# Patient Record
Sex: Female | Born: 1970
Health system: Southern US, Community
[De-identification: ages and names within clinical notes are randomized; demographics above are authoritative.]

## PROBLEM LIST (undated history)

## (undated) DIAGNOSIS — J45909 Unspecified asthma, uncomplicated: Secondary | ICD-10-CM

## (undated) DIAGNOSIS — F259 Schizoaffective disorder, unspecified: Secondary | ICD-10-CM

## (undated) DIAGNOSIS — F32A Depression, unspecified: Secondary | ICD-10-CM

## (undated) DIAGNOSIS — H269 Unspecified cataract: Secondary | ICD-10-CM

## (undated) DIAGNOSIS — F419 Anxiety disorder, unspecified: Secondary | ICD-10-CM

## (undated) DIAGNOSIS — R011 Cardiac murmur, unspecified: Secondary | ICD-10-CM

## (undated) DIAGNOSIS — Z972 Presence of dental prosthetic device (complete) (partial): Secondary | ICD-10-CM

## (undated) DIAGNOSIS — H409 Unspecified glaucoma: Secondary | ICD-10-CM

## (undated) DIAGNOSIS — G43909 Migraine, unspecified, not intractable, without status migrainosus: Secondary | ICD-10-CM

## (undated) DIAGNOSIS — Z973 Presence of spectacles and contact lenses: Secondary | ICD-10-CM

## (undated) HISTORY — PX: ABDOMINAL SURGERY: SHX537

## (undated) HISTORY — PX: LASIK: SHX215

## (undated) HISTORY — PX: HERNIA REPAIR: SHX51

---

## 1999-04-18 ENCOUNTER — Other Ambulatory Visit: Admission: RE | Admit: 1999-04-18 | Discharge: 1999-04-18 | Payer: Self-pay | Admitting: Family Medicine

## 1999-12-10 ENCOUNTER — Emergency Department (HOSPITAL_COMMUNITY): Admission: EM | Admit: 1999-12-10 | Discharge: 1999-12-10 | Payer: Self-pay | Admitting: Emergency Medicine

## 1999-12-10 ENCOUNTER — Encounter: Payer: Self-pay | Admitting: Emergency Medicine

## 2000-06-16 ENCOUNTER — Other Ambulatory Visit: Admission: RE | Admit: 2000-06-16 | Discharge: 2000-06-16 | Payer: Self-pay | Admitting: Family Medicine

## 2000-07-12 ENCOUNTER — Emergency Department (HOSPITAL_COMMUNITY): Admission: EM | Admit: 2000-07-12 | Discharge: 2000-07-12 | Payer: Self-pay | Admitting: Emergency Medicine

## 2000-07-12 ENCOUNTER — Encounter: Payer: Self-pay | Admitting: Emergency Medicine

## 2000-11-05 ENCOUNTER — Emergency Department (HOSPITAL_COMMUNITY): Admission: EM | Admit: 2000-11-05 | Discharge: 2000-11-05 | Payer: Self-pay | Admitting: Emergency Medicine

## 2001-06-17 ENCOUNTER — Other Ambulatory Visit: Admission: RE | Admit: 2001-06-17 | Discharge: 2001-06-17 | Payer: Self-pay | Admitting: Family Medicine

## 2003-03-01 HISTORY — PX: GASTRIC BYPASS: SHX52

## 2003-04-27 ENCOUNTER — Other Ambulatory Visit: Admission: RE | Admit: 2003-04-27 | Discharge: 2003-04-27 | Payer: Self-pay | Admitting: Family Medicine

## 2004-06-25 ENCOUNTER — Other Ambulatory Visit: Admission: RE | Admit: 2004-06-25 | Discharge: 2004-06-25 | Payer: Self-pay | Admitting: Family Medicine

## 2005-09-30 ENCOUNTER — Other Ambulatory Visit: Admission: RE | Admit: 2005-09-30 | Discharge: 2005-09-30 | Payer: Self-pay | Admitting: Family Medicine

## 2006-06-11 ENCOUNTER — Encounter: Admission: RE | Admit: 2006-06-11 | Discharge: 2006-06-11 | Payer: Self-pay | Admitting: Family Medicine

## 2006-10-14 ENCOUNTER — Other Ambulatory Visit: Admission: RE | Admit: 2006-10-14 | Discharge: 2006-10-14 | Payer: Self-pay | Admitting: Family Medicine

## 2007-10-14 ENCOUNTER — Other Ambulatory Visit: Admission: RE | Admit: 2007-10-14 | Discharge: 2007-10-14 | Payer: Self-pay | Admitting: Family Medicine

## 2008-10-04 ENCOUNTER — Other Ambulatory Visit: Admission: RE | Admit: 2008-10-04 | Discharge: 2008-10-04 | Payer: Self-pay | Admitting: Family Medicine

## 2010-06-04 ENCOUNTER — Emergency Department (HOSPITAL_BASED_OUTPATIENT_CLINIC_OR_DEPARTMENT_OTHER)
Admission: EM | Admit: 2010-06-04 | Discharge: 2010-06-04 | Disposition: A | Discharge status: Home / Self Care | Payer: Medicare Other | Attending: Emergency Medicine | Admitting: Emergency Medicine

## 2010-06-04 DIAGNOSIS — R221 Localized swelling, mass and lump, neck: Secondary | ICD-10-CM | POA: Insufficient documentation

## 2010-06-04 DIAGNOSIS — R220 Localized swelling, mass and lump, head: Secondary | ICD-10-CM | POA: Insufficient documentation

## 2010-06-04 DIAGNOSIS — F17200 Nicotine dependence, unspecified, uncomplicated: Secondary | ICD-10-CM | POA: Insufficient documentation

## 2011-02-12 ENCOUNTER — Other Ambulatory Visit (HOSPITAL_COMMUNITY)
Admission: RE | Admit: 2011-02-12 | Discharge: 2011-02-12 | Disposition: A | Discharge status: Home / Self Care | Payer: Medicare Other | Source: Ambulatory Visit | Attending: Family Medicine | Admitting: Family Medicine

## 2011-02-12 ENCOUNTER — Other Ambulatory Visit: Payer: Self-pay | Admitting: Family Medicine

## 2011-02-12 DIAGNOSIS — Z23 Encounter for immunization: Secondary | ICD-10-CM | POA: Diagnosis not present

## 2011-02-12 DIAGNOSIS — Z124 Encounter for screening for malignant neoplasm of cervix: Secondary | ICD-10-CM | POA: Diagnosis not present

## 2011-02-12 DIAGNOSIS — J45909 Unspecified asthma, uncomplicated: Secondary | ICD-10-CM | POA: Diagnosis not present

## 2011-02-12 DIAGNOSIS — Z01419 Encounter for gynecological examination (general) (routine) without abnormal findings: Secondary | ICD-10-CM | POA: Diagnosis not present

## 2011-02-12 DIAGNOSIS — Z79899 Other long term (current) drug therapy: Secondary | ICD-10-CM | POA: Diagnosis not present

## 2011-02-12 DIAGNOSIS — Z0000 Encounter for general adult medical examination without abnormal findings: Secondary | ICD-10-CM | POA: Diagnosis not present

## 2011-02-12 DIAGNOSIS — F329 Major depressive disorder, single episode, unspecified: Secondary | ICD-10-CM | POA: Diagnosis not present

## 2011-02-25 DIAGNOSIS — G43909 Migraine, unspecified, not intractable, without status migrainosus: Secondary | ICD-10-CM | POA: Diagnosis not present

## 2011-02-25 DIAGNOSIS — N959 Unspecified menopausal and perimenopausal disorder: Secondary | ICD-10-CM | POA: Diagnosis not present

## 2011-03-31 DIAGNOSIS — F259 Schizoaffective disorder, unspecified: Secondary | ICD-10-CM | POA: Diagnosis not present

## 2011-05-19 DIAGNOSIS — F259 Schizoaffective disorder, unspecified: Secondary | ICD-10-CM | POA: Diagnosis not present

## 2011-06-16 DIAGNOSIS — F259 Schizoaffective disorder, unspecified: Secondary | ICD-10-CM | POA: Diagnosis not present

## 2011-07-14 DIAGNOSIS — F259 Schizoaffective disorder, unspecified: Secondary | ICD-10-CM | POA: Diagnosis not present

## 2011-09-22 DIAGNOSIS — F259 Schizoaffective disorder, unspecified: Secondary | ICD-10-CM | POA: Diagnosis not present

## 2012-02-27 DIAGNOSIS — Z136 Encounter for screening for cardiovascular disorders: Secondary | ICD-10-CM | POA: Diagnosis not present

## 2012-02-27 DIAGNOSIS — J45909 Unspecified asthma, uncomplicated: Secondary | ICD-10-CM | POA: Diagnosis not present

## 2012-02-27 DIAGNOSIS — Z980 Intestinal bypass and anastomosis status: Secondary | ICD-10-CM | POA: Diagnosis not present

## 2012-02-27 DIAGNOSIS — R51 Headache: Secondary | ICD-10-CM | POA: Diagnosis not present

## 2012-02-27 DIAGNOSIS — R011 Cardiac murmur, unspecified: Secondary | ICD-10-CM | POA: Diagnosis not present

## 2012-02-27 DIAGNOSIS — Z0000 Encounter for general adult medical examination without abnormal findings: Secondary | ICD-10-CM | POA: Diagnosis not present

## 2012-06-16 DIAGNOSIS — H40019 Open angle with borderline findings, low risk, unspecified eye: Secondary | ICD-10-CM | POA: Diagnosis not present

## 2012-06-16 DIAGNOSIS — H524 Presbyopia: Secondary | ICD-10-CM | POA: Diagnosis not present

## 2012-06-23 DIAGNOSIS — F259 Schizoaffective disorder, unspecified: Secondary | ICD-10-CM | POA: Diagnosis not present

## 2012-06-24 DIAGNOSIS — F259 Schizoaffective disorder, unspecified: Secondary | ICD-10-CM | POA: Diagnosis not present

## 2012-07-20 DIAGNOSIS — H40019 Open angle with borderline findings, low risk, unspecified eye: Secondary | ICD-10-CM | POA: Diagnosis not present

## 2012-12-15 DIAGNOSIS — F259 Schizoaffective disorder, unspecified: Secondary | ICD-10-CM | POA: Diagnosis not present

## 2013-01-13 DIAGNOSIS — R209 Unspecified disturbances of skin sensation: Secondary | ICD-10-CM | POA: Diagnosis not present

## 2013-01-13 DIAGNOSIS — G43119 Migraine with aura, intractable, without status migrainosus: Secondary | ICD-10-CM | POA: Diagnosis not present

## 2013-01-13 DIAGNOSIS — R51 Headache: Secondary | ICD-10-CM | POA: Diagnosis not present

## 2013-01-13 DIAGNOSIS — H538 Other visual disturbances: Secondary | ICD-10-CM | POA: Diagnosis not present

## 2013-01-18 DIAGNOSIS — G43109 Migraine with aura, not intractable, without status migrainosus: Secondary | ICD-10-CM | POA: Diagnosis not present

## 2013-01-18 DIAGNOSIS — H538 Other visual disturbances: Secondary | ICD-10-CM | POA: Diagnosis not present

## 2013-01-18 DIAGNOSIS — R51 Headache: Secondary | ICD-10-CM | POA: Diagnosis not present

## 2013-01-18 DIAGNOSIS — R209 Unspecified disturbances of skin sensation: Secondary | ICD-10-CM | POA: Diagnosis not present

## 2013-01-27 DIAGNOSIS — R209 Unspecified disturbances of skin sensation: Secondary | ICD-10-CM | POA: Diagnosis not present

## 2013-01-27 DIAGNOSIS — G43119 Migraine with aura, intractable, without status migrainosus: Secondary | ICD-10-CM | POA: Diagnosis not present

## 2013-01-27 DIAGNOSIS — G43009 Migraine without aura, not intractable, without status migrainosus: Secondary | ICD-10-CM | POA: Diagnosis not present

## 2013-01-27 DIAGNOSIS — H538 Other visual disturbances: Secondary | ICD-10-CM | POA: Diagnosis not present

## 2013-02-01 DIAGNOSIS — G43819 Other migraine, intractable, without status migrainosus: Secondary | ICD-10-CM | POA: Diagnosis not present

## 2013-02-01 DIAGNOSIS — H47329 Drusen of optic disc, unspecified eye: Secondary | ICD-10-CM | POA: Diagnosis not present

## 2013-02-01 DIAGNOSIS — H47339 Pseudopapilledema of optic disc, unspecified eye: Secondary | ICD-10-CM | POA: Diagnosis not present

## 2013-02-01 DIAGNOSIS — G43119 Migraine with aura, intractable, without status migrainosus: Secondary | ICD-10-CM | POA: Diagnosis not present

## 2013-02-01 DIAGNOSIS — H40039 Anatomical narrow angle, unspecified eye: Secondary | ICD-10-CM | POA: Diagnosis not present

## 2013-02-16 DIAGNOSIS — G43819 Other migraine, intractable, without status migrainosus: Secondary | ICD-10-CM | POA: Diagnosis not present

## 2013-02-16 DIAGNOSIS — H47339 Pseudopapilledema of optic disc, unspecified eye: Secondary | ICD-10-CM | POA: Diagnosis not present

## 2013-02-16 DIAGNOSIS — H47329 Drusen of optic disc, unspecified eye: Secondary | ICD-10-CM | POA: Diagnosis not present

## 2013-02-16 DIAGNOSIS — H40039 Anatomical narrow angle, unspecified eye: Secondary | ICD-10-CM | POA: Diagnosis not present

## 2013-02-16 DIAGNOSIS — G43119 Migraine with aura, intractable, without status migrainosus: Secondary | ICD-10-CM | POA: Diagnosis not present

## 2013-02-17 DIAGNOSIS — H47329 Drusen of optic disc, unspecified eye: Secondary | ICD-10-CM | POA: Diagnosis not present

## 2013-02-17 DIAGNOSIS — H40039 Anatomical narrow angle, unspecified eye: Secondary | ICD-10-CM | POA: Diagnosis not present

## 2013-02-17 DIAGNOSIS — Z83511 Family history of glaucoma: Secondary | ICD-10-CM | POA: Diagnosis not present

## 2013-02-17 DIAGNOSIS — H47339 Pseudopapilledema of optic disc, unspecified eye: Secondary | ICD-10-CM | POA: Diagnosis not present

## 2013-03-03 DIAGNOSIS — J45909 Unspecified asthma, uncomplicated: Secondary | ICD-10-CM | POA: Diagnosis not present

## 2013-03-03 DIAGNOSIS — R209 Unspecified disturbances of skin sensation: Secondary | ICD-10-CM | POA: Diagnosis not present

## 2013-03-03 DIAGNOSIS — Z79899 Other long term (current) drug therapy: Secondary | ICD-10-CM | POA: Diagnosis not present

## 2013-03-03 DIAGNOSIS — R51 Headache: Secondary | ICD-10-CM | POA: Diagnosis not present

## 2013-03-03 DIAGNOSIS — Z980 Intestinal bypass and anastomosis status: Secondary | ICD-10-CM | POA: Diagnosis not present

## 2013-03-03 DIAGNOSIS — F209 Schizophrenia, unspecified: Secondary | ICD-10-CM | POA: Diagnosis not present

## 2013-03-03 DIAGNOSIS — G43009 Migraine without aura, not intractable, without status migrainosus: Secondary | ICD-10-CM | POA: Diagnosis not present

## 2013-03-03 DIAGNOSIS — Z0000 Encounter for general adult medical examination without abnormal findings: Secondary | ICD-10-CM | POA: Diagnosis not present

## 2013-03-03 DIAGNOSIS — G43119 Migraine with aura, intractable, without status migrainosus: Secondary | ICD-10-CM | POA: Diagnosis not present

## 2013-03-03 DIAGNOSIS — R011 Cardiac murmur, unspecified: Secondary | ICD-10-CM | POA: Diagnosis not present

## 2013-03-04 ENCOUNTER — Other Ambulatory Visit: Payer: Self-pay

## 2013-03-07 DIAGNOSIS — Z1231 Encounter for screening mammogram for malignant neoplasm of breast: Secondary | ICD-10-CM | POA: Diagnosis not present

## 2013-04-28 DIAGNOSIS — G43009 Migraine without aura, not intractable, without status migrainosus: Secondary | ICD-10-CM | POA: Diagnosis not present

## 2013-04-28 DIAGNOSIS — G43119 Migraine with aura, intractable, without status migrainosus: Secondary | ICD-10-CM | POA: Diagnosis not present

## 2013-04-28 DIAGNOSIS — R209 Unspecified disturbances of skin sensation: Secondary | ICD-10-CM | POA: Diagnosis not present

## 2013-05-19 DIAGNOSIS — H47329 Drusen of optic disc, unspecified eye: Secondary | ICD-10-CM | POA: Diagnosis not present

## 2013-05-19 DIAGNOSIS — G43819 Other migraine, intractable, without status migrainosus: Secondary | ICD-10-CM | POA: Diagnosis not present

## 2013-05-19 DIAGNOSIS — H40039 Anatomical narrow angle, unspecified eye: Secondary | ICD-10-CM | POA: Diagnosis not present

## 2013-05-19 DIAGNOSIS — H47339 Pseudopapilledema of optic disc, unspecified eye: Secondary | ICD-10-CM | POA: Diagnosis not present

## 2013-05-19 DIAGNOSIS — G43119 Migraine with aura, intractable, without status migrainosus: Secondary | ICD-10-CM | POA: Diagnosis not present

## 2013-05-23 DIAGNOSIS — D649 Anemia, unspecified: Secondary | ICD-10-CM | POA: Diagnosis not present

## 2013-05-23 DIAGNOSIS — E559 Vitamin D deficiency, unspecified: Secondary | ICD-10-CM | POA: Diagnosis not present

## 2013-06-01 DIAGNOSIS — H409 Unspecified glaucoma: Secondary | ICD-10-CM | POA: Diagnosis not present

## 2013-06-01 DIAGNOSIS — F259 Schizoaffective disorder, unspecified: Secondary | ICD-10-CM | POA: Diagnosis not present

## 2013-06-15 DIAGNOSIS — H40039 Anatomical narrow angle, unspecified eye: Secondary | ICD-10-CM | POA: Diagnosis not present

## 2013-06-30 DIAGNOSIS — F259 Schizoaffective disorder, unspecified: Secondary | ICD-10-CM | POA: Diagnosis not present

## 2013-08-18 ENCOUNTER — Encounter (HOSPITAL_BASED_OUTPATIENT_CLINIC_OR_DEPARTMENT_OTHER): Payer: Self-pay | Admitting: Emergency Medicine

## 2013-08-18 ENCOUNTER — Emergency Department (HOSPITAL_BASED_OUTPATIENT_CLINIC_OR_DEPARTMENT_OTHER)
Admission: EM | Admit: 2013-08-18 | Discharge: 2013-08-18 | Disposition: A | Discharge status: Home / Self Care | Payer: Medicare Other | Attending: Emergency Medicine | Admitting: Emergency Medicine

## 2013-08-18 DIAGNOSIS — Y9389 Activity, other specified: Secondary | ICD-10-CM | POA: Insufficient documentation

## 2013-08-18 DIAGNOSIS — Y9289 Other specified places as the place of occurrence of the external cause: Secondary | ICD-10-CM | POA: Insufficient documentation

## 2013-08-18 DIAGNOSIS — F17200 Nicotine dependence, unspecified, uncomplicated: Secondary | ICD-10-CM | POA: Diagnosis not present

## 2013-08-18 DIAGNOSIS — H409 Unspecified glaucoma: Secondary | ICD-10-CM | POA: Diagnosis not present

## 2013-08-18 DIAGNOSIS — T63441A Toxic effect of venom of bees, accidental (unintentional), initial encounter: Secondary | ICD-10-CM

## 2013-08-18 DIAGNOSIS — T6391XA Toxic effect of contact with unspecified venomous animal, accidental (unintentional), initial encounter: Secondary | ICD-10-CM | POA: Diagnosis not present

## 2013-08-18 DIAGNOSIS — H47329 Drusen of optic disc, unspecified eye: Secondary | ICD-10-CM | POA: Diagnosis not present

## 2013-08-18 DIAGNOSIS — T63461A Toxic effect of venom of wasps, accidental (unintentional), initial encounter: Secondary | ICD-10-CM | POA: Insufficient documentation

## 2013-08-18 MED ORDER — PREDNISONE 50 MG PO TABS
60.0000 mg | ORAL_TABLET | Freq: Once | ORAL | Status: AC
  Administered 2013-08-18: 60 mg via ORAL
  Filled 2013-08-18 (×2): qty 1

## 2013-08-18 NOTE — ED Notes (Signed)
Pt was stung multiple times by what she believes was yellow jackets. She is allergic so she took 2 benadryl and gave herself her epi-pen. She is not sure if she used her epi-pen properly but at this time denies SOB or difficulty swallowing. She sts the last time she was stung, the reaction occurred at night while sleeping.

## 2013-08-18 NOTE — ED Provider Notes (Signed)
CSN: [id number]     Arrival date & time 08/18/13  2013 History  This chart was scribed for Hurman Horn, MD by Chestine Spore, ED Scribe. The patient was seen in room MH01/MH01 at 10:37 PM.      Chief Complaint  Patient presents with  . Insect Bite    The history is provided by the patient. No language interpreter was used.   HPI Comments: NICHELLE RENWICK is a 43 y.o. female who presents to the Emergency Department complaining of a insect bite. She states that she was stung multiple times on her legs by what she believes was yellow jackets. She states that she is allergic so she took 2 benadryl and gave herself her epi-pen.  She states that the last time she was stung, the reaction occurred at night while sleeping and she woke with problem breathing.   She states that she is breathing fine this time. She states that she felt several stings at multiple sites. She states that she is not having a rash, fainting, CP, SOB, difficulty swallowing, vomit, tongue or lip swelling. She currently denies SOB or difficulty swallowing.   History reviewed. No pertinent past medical history. Past Surgical History  Procedure Laterality Date  . Lasik    . Cesarean section    . Gastric bypass     No family history on file. History  Substance Use Topics  . Smoking status: Current Every Day Smoker  . Smokeless tobacco: Not on file  . Alcohol Use: No   OB History   Grav Para Term Preterm Abortions TAB SAB Ect Mult Living                 Review of Systems  HENT: Negative for trouble swallowing.   Respiratory: Negative for shortness of breath.   Skin: Negative for rash.  Neurological: Negative for syncope.     10 Systems reviewed and are negative for acute change except as noted in the HPI.  Allergies  Bee venom  Home Medications   Prior to Admission medications   Not on File   BP 133/76  Pulse 92  Temp(Src) 98.5 F (36.9 C)  Resp 16  Ht 5\' 5"  (1.651 m)  Wt 183 lb (83.008 kg)  BMI  30.45 kg/m2  SpO2 100%  LMP 08/07/2013  Physical Exam  Nursing note and vitals reviewed. Constitutional:  Awake, alert, nontoxic appearance.  HENT:  Head: Atraumatic.  Mouth/Throat: Oropharynx is clear and moist.  Tongue normal.   Eyes: Right eye exhibits no discharge. Left eye exhibits no discharge.  Neck: Neck supple.  Cardiovascular: Normal rate and regular rhythm.   No murmur heard. Pulmonary/Chest: Effort normal and breath sounds normal. No respiratory distress. She has no wheezes. She has no rales. She exhibits no tenderness.  Abdominal: Soft. There is no tenderness. There is no rebound.  Musculoskeletal: She exhibits no tenderness.  Several scattered bilateral lower extremity tiny erythematous papules consisted with stings. Baseline ROM, no obvious new focal weakness.   Neurological:  Mental status and motor strength appears baseline for patient and situation.  Skin: No rash noted.  Psychiatric: She has a normal mood and affect.    ED Course  Procedures (including critical care time) DIAGNOSTIC STUDIES: Oxygen Saturation is 100% on room air, normal by my interpretation.    COORDINATION OF CARE: 10:43 PM-Discussed treatment plan which includes Prednisone with pt at bedside and pt agreed to plan.   Labs Review Labs Reviewed - No data to  display  Imaging Review No results found.   EKG Interpretation None     Patient informed of clinical course, understand medical decision-making process, and agree with plan.  MDM   Final diagnoses:  Bee sting reaction, accidental or unintentional, initial encounter     I doubt any other EMC precluding discharge at this time including, but not necessarily limited to the following:anaphylaxis.  I personally performed the services described in this documentation, which was scribed in my presence. The recorded information has been reviewed and is accurate.    Hurman HornJohn M Jenesys Casseus, MD 08/19/13 [phone]

## 2013-11-01 DIAGNOSIS — L259 Unspecified contact dermatitis, unspecified cause: Secondary | ICD-10-CM | POA: Diagnosis not present

## 2013-11-23 DIAGNOSIS — F259 Schizoaffective disorder, unspecified: Secondary | ICD-10-CM | POA: Diagnosis not present

## 2014-01-10 DIAGNOSIS — F250 Schizoaffective disorder, bipolar type: Secondary | ICD-10-CM | POA: Diagnosis not present

## 2014-02-14 DIAGNOSIS — F250 Schizoaffective disorder, bipolar type: Secondary | ICD-10-CM | POA: Diagnosis not present

## 2014-02-20 DIAGNOSIS — H524 Presbyopia: Secondary | ICD-10-CM | POA: Diagnosis not present

## 2014-02-20 DIAGNOSIS — H40233 Intermittent angle-closure glaucoma, bilateral: Secondary | ICD-10-CM | POA: Diagnosis not present

## 2014-02-20 DIAGNOSIS — H5203 Hypermetropia, bilateral: Secondary | ICD-10-CM | POA: Diagnosis not present

## 2014-02-20 DIAGNOSIS — H47323 Drusen of optic disc, bilateral: Secondary | ICD-10-CM | POA: Diagnosis not present

## 2014-02-20 DIAGNOSIS — G43819 Other migraine, intractable, without status migrainosus: Secondary | ICD-10-CM | POA: Diagnosis not present

## 2014-02-22 DIAGNOSIS — F250 Schizoaffective disorder, bipolar type: Secondary | ICD-10-CM | POA: Diagnosis not present

## 2014-03-06 ENCOUNTER — Other Ambulatory Visit (HOSPITAL_COMMUNITY)
Admission: RE | Admit: 2014-03-06 | Discharge: 2014-03-06 | Disposition: A | Discharge status: Home / Self Care | Payer: Medicare Other | Source: Ambulatory Visit | Attending: Family Medicine | Admitting: Family Medicine

## 2014-03-06 ENCOUNTER — Other Ambulatory Visit: Payer: Self-pay | Admitting: Family Medicine

## 2014-03-06 DIAGNOSIS — F17210 Nicotine dependence, cigarettes, uncomplicated: Secondary | ICD-10-CM | POA: Diagnosis not present

## 2014-03-06 DIAGNOSIS — Z0000 Encounter for general adult medical examination without abnormal findings: Secondary | ICD-10-CM | POA: Diagnosis not present

## 2014-03-06 DIAGNOSIS — N951 Menopausal and female climacteric states: Secondary | ICD-10-CM | POA: Diagnosis not present

## 2014-03-06 DIAGNOSIS — Z01419 Encounter for gynecological examination (general) (routine) without abnormal findings: Secondary | ICD-10-CM | POA: Diagnosis not present

## 2014-03-06 DIAGNOSIS — J4520 Mild intermittent asthma, uncomplicated: Secondary | ICD-10-CM | POA: Diagnosis not present

## 2014-03-06 DIAGNOSIS — F209 Schizophrenia, unspecified: Secondary | ICD-10-CM | POA: Diagnosis not present

## 2014-03-06 DIAGNOSIS — D509 Iron deficiency anemia, unspecified: Secondary | ICD-10-CM | POA: Diagnosis not present

## 2014-03-06 DIAGNOSIS — Z9884 Bariatric surgery status: Secondary | ICD-10-CM | POA: Diagnosis not present

## 2014-03-06 DIAGNOSIS — R010 Benign and innocent cardiac murmurs: Secondary | ICD-10-CM | POA: Diagnosis not present

## 2014-03-06 DIAGNOSIS — Z1389 Encounter for screening for other disorder: Secondary | ICD-10-CM | POA: Diagnosis not present

## 2014-03-06 DIAGNOSIS — Z79899 Other long term (current) drug therapy: Secondary | ICD-10-CM | POA: Diagnosis not present

## 2014-03-06 DIAGNOSIS — Z124 Encounter for screening for malignant neoplasm of cervix: Secondary | ICD-10-CM | POA: Diagnosis not present

## 2014-03-08 LAB — CYTOLOGY - PAP

## 2014-03-14 DIAGNOSIS — F250 Schizoaffective disorder, bipolar type: Secondary | ICD-10-CM | POA: Diagnosis not present

## 2014-04-14 DIAGNOSIS — L239 Allergic contact dermatitis, unspecified cause: Secondary | ICD-10-CM | POA: Diagnosis not present

## 2014-05-23 DIAGNOSIS — F259 Schizoaffective disorder, unspecified: Secondary | ICD-10-CM | POA: Diagnosis not present

## 2014-12-06 DIAGNOSIS — J329 Chronic sinusitis, unspecified: Secondary | ICD-10-CM | POA: Diagnosis not present

## 2014-12-20 DIAGNOSIS — F259 Schizoaffective disorder, unspecified: Secondary | ICD-10-CM | POA: Diagnosis not present

## 2014-12-21 DIAGNOSIS — F259 Schizoaffective disorder, unspecified: Secondary | ICD-10-CM | POA: Diagnosis not present

## 2014-12-27 DIAGNOSIS — F259 Schizoaffective disorder, unspecified: Secondary | ICD-10-CM | POA: Diagnosis not present

## 2014-12-28 DIAGNOSIS — F259 Schizoaffective disorder, unspecified: Secondary | ICD-10-CM | POA: Diagnosis not present

## 2015-01-09 DIAGNOSIS — F259 Schizoaffective disorder, unspecified: Secondary | ICD-10-CM | POA: Diagnosis not present

## 2015-02-22 DIAGNOSIS — F259 Schizoaffective disorder, unspecified: Secondary | ICD-10-CM | POA: Diagnosis not present

## 2015-03-02 ENCOUNTER — Emergency Department (HOSPITAL_BASED_OUTPATIENT_CLINIC_OR_DEPARTMENT_OTHER)
Admission: EM | Admit: 2015-03-02 | Discharge: 2015-03-02 | Disposition: A | Discharge status: Home / Self Care | Payer: Medicare Other | Attending: Emergency Medicine | Admitting: Emergency Medicine

## 2015-03-02 ENCOUNTER — Emergency Department (HOSPITAL_BASED_OUTPATIENT_CLINIC_OR_DEPARTMENT_OTHER): Payer: Medicare Other

## 2015-03-02 ENCOUNTER — Encounter (HOSPITAL_BASED_OUTPATIENT_CLINIC_OR_DEPARTMENT_OTHER): Payer: Self-pay

## 2015-03-02 DIAGNOSIS — R05 Cough: Secondary | ICD-10-CM | POA: Diagnosis not present

## 2015-03-02 DIAGNOSIS — R109 Unspecified abdominal pain: Secondary | ICD-10-CM | POA: Diagnosis not present

## 2015-03-02 DIAGNOSIS — Z79899 Other long term (current) drug therapy: Secondary | ICD-10-CM | POA: Insufficient documentation

## 2015-03-02 DIAGNOSIS — R1012 Left upper quadrant pain: Secondary | ICD-10-CM | POA: Diagnosis not present

## 2015-03-02 DIAGNOSIS — R110 Nausea: Secondary | ICD-10-CM | POA: Insufficient documentation

## 2015-03-02 DIAGNOSIS — K5900 Constipation, unspecified: Secondary | ICD-10-CM | POA: Diagnosis not present

## 2015-03-02 DIAGNOSIS — Z87891 Personal history of nicotine dependence: Secondary | ICD-10-CM | POA: Diagnosis not present

## 2015-03-02 DIAGNOSIS — Z8669 Personal history of other diseases of the nervous system and sense organs: Secondary | ICD-10-CM | POA: Diagnosis not present

## 2015-03-02 DIAGNOSIS — Z3202 Encounter for pregnancy test, result negative: Secondary | ICD-10-CM | POA: Diagnosis not present

## 2015-03-02 DIAGNOSIS — R1011 Right upper quadrant pain: Secondary | ICD-10-CM | POA: Diagnosis present

## 2015-03-02 HISTORY — DX: Unspecified glaucoma: H40.9

## 2015-03-02 LAB — I-STAT CG4 LACTIC ACID, ED: LACTIC ACID, VENOUS: 0.78 mmol/L (ref 0.5–2.0)

## 2015-03-02 LAB — CBC WITH DIFFERENTIAL/PLATELET
BASOS ABS: 0 10*3/uL (ref 0.0–0.1)
BASOS PCT: 0 %
EOS ABS: 0.1 10*3/uL (ref 0.0–0.7)
EOS PCT: 1 %
HCT: 35.2 % — ABNORMAL LOW (ref 36.0–46.0)
Hemoglobin: 11.5 g/dL — ABNORMAL LOW (ref 12.0–15.0)
LYMPHS PCT: 38 %
Lymphs Abs: 1.7 10*3/uL (ref 0.7–4.0)
MCH: 30.3 pg (ref 26.0–34.0)
MCHC: 32.7 g/dL (ref 30.0–36.0)
MCV: 92.6 fL (ref 78.0–100.0)
MONO ABS: 0.5 10*3/uL (ref 0.1–1.0)
Monocytes Relative: 12 %
Neutro Abs: 2.2 10*3/uL (ref 1.7–7.7)
Neutrophils Relative %: 49 %
PLATELETS: 263 10*3/uL (ref 150–400)
RBC: 3.8 MIL/uL — ABNORMAL LOW (ref 3.87–5.11)
RDW: 12 % (ref 11.5–15.5)
WBC: 4.4 10*3/uL (ref 4.0–10.5)

## 2015-03-02 LAB — URINALYSIS, ROUTINE W REFLEX MICROSCOPIC
BILIRUBIN URINE: NEGATIVE
GLUCOSE, UA: NEGATIVE mg/dL
HGB URINE DIPSTICK: NEGATIVE
KETONES UR: NEGATIVE mg/dL
Leukocytes, UA: NEGATIVE
Nitrite: NEGATIVE
PH: 6 (ref 5.0–8.0)
PROTEIN: NEGATIVE mg/dL
Specific Gravity, Urine: 1.025 (ref 1.005–1.030)

## 2015-03-02 LAB — COMPREHENSIVE METABOLIC PANEL
ALT: 15 U/L (ref 14–54)
AST: 21 U/L (ref 15–41)
Albumin: 3.9 g/dL (ref 3.5–5.0)
Alkaline Phosphatase: 48 U/L (ref 38–126)
Anion gap: 5 (ref 5–15)
BUN: 16 mg/dL (ref 6–20)
CHLORIDE: 104 mmol/L (ref 101–111)
CO2: 25 mmol/L (ref 22–32)
Calcium: 8.9 mg/dL (ref 8.9–10.3)
Creatinine, Ser: 0.79 mg/dL (ref 0.44–1.00)
Glucose, Bld: 90 mg/dL (ref 65–99)
POTASSIUM: 4.1 mmol/L (ref 3.5–5.1)
Sodium: 134 mmol/L — ABNORMAL LOW (ref 135–145)
Total Bilirubin: 0.5 mg/dL (ref 0.3–1.2)
Total Protein: 7.2 g/dL (ref 6.5–8.1)

## 2015-03-02 LAB — LIPASE, BLOOD: LIPASE: 17 U/L (ref 11–51)

## 2015-03-02 LAB — PREGNANCY, URINE: Preg Test, Ur: NEGATIVE

## 2015-03-02 MED ORDER — IOHEXOL 300 MG/ML  SOLN
25.0000 mL | Freq: Once | INTRAMUSCULAR | Status: AC | PRN
  Administered 2015-03-02: 25 mL via ORAL

## 2015-03-02 MED ORDER — SODIUM CHLORIDE 0.9 % IV BOLUS (SEPSIS)
1000.0000 mL | Freq: Once | INTRAVENOUS | Status: AC
  Administered 2015-03-02: 1000 mL via INTRAVENOUS

## 2015-03-02 MED ORDER — IOHEXOL 300 MG/ML  SOLN
100.0000 mL | Freq: Once | INTRAMUSCULAR | Status: AC | PRN
Start: 2015-03-02 — End: 2015-03-02
  Administered 2015-03-02: 100 mL via INTRAVENOUS

## 2015-03-02 MED ORDER — ONDANSETRON HCL 4 MG/2ML IJ SOLN
4.0000 mg | Freq: Once | INTRAMUSCULAR | Status: DC
  Filled 2015-03-02: qty 2

## 2015-03-02 NOTE — ED Provider Notes (Signed)
CSN: [id number]     Arrival date & time 03/02/15  1902 History  By signing my name below, I, Melinda Elliott, attest that this documentation has been prepared under the direction and in the presence of Eber Hong, MD . Electronically Signed: Marisue Elliott, Scribe. 03/02/2015. 8:33 PM.  Chief Complaint  Patient presents with  . Abdominal Pain   The history is provided by the patient. No language interpreter was used.   HPI Comments:  Melinda Elliott is a 45 y.o. female with a history of laparoscopic gastric bypass (Jan. 19th, 2005), who presents to the Emergency Department complaining of moderate RUQ, LUQ, and LLQ abdominal pain for ~ 2 days. She also describes a burning pain behind her ribs. Pt reports associated nausea, and vomiting. Her pain is worse with anything PO. Pt states she has passed gas today but has not had a BM.  No alleviating factors noted. Pt denies dysuria, chills, and fever. Pt had laparoscopic gastric bypass 12 years ago.  A year and a half later, she experienced a rupture that needed to be surgically fixed and in 2008, her large intestine burst again requiring surgical repair. She states she has had no symptoms since that time. Pt has lost ~140 lbs since the surgery; she notes she has started exercising regualrly and has ceased smoking in the past year. She also notes she has taken Ibuprofen 800 mg BID for 5 years.   Past Medical History  Diagnosis Date  . Glaucoma    Past Surgical History  Procedure Laterality Date  . Lasik    . Cesarean section    . Gastric bypass    . Abdominal surgery    . Hernia repair     No family history on file. Social History  Substance Use Topics  . Smoking status: Former Games developer  . Smokeless tobacco: None  . Alcohol Use: Yes     Comment: occ   OB History    No data available     Review of Systems  Constitutional: Negative for fever and chills.  Respiratory: Positive for cough.   Gastrointestinal: Positive for nausea,  vomiting and abdominal pain.  Genitourinary: Negative for dysuria.  All other systems reviewed and are negative.  Allergies  Bee venom and Shrimp  Home Medications   Prior to Admission medications   Medication Sig Start Date End Date Taking? Authorizing Provider  GABAPENTIN PO Take by mouth.   Yes Historical Provider, MD  Multiple Vitamin (MULTIVITAMIN) capsule Take 1 capsule by mouth daily.   Yes Historical Provider, MD   BP 120/71 mmHg  Pulse 55  Temp(Src) 97.7 F (36.5 C) (Oral)  Resp 18  Ht  (1.651 m)  Wt 182 lb (82.555 kg)  BMI 30.29 kg/m2  SpO2 100%  LMP 02/22/2015 Physical Exam  Constitutional: She appears well-developed and well-nourished. No distress.  HENT:  Head: Normocephalic and atraumatic.  Mouth/Throat: Oropharynx is clear and moist. No oropharyngeal exudate.  Eyes: Conjunctivae and EOM are normal. Pupils are equal, round, and reactive to light. Right eye exhibits no discharge. Left eye exhibits no discharge. No scleral icterus.  Neck: Normal range of motion. Neck supple. No JVD present. No thyromegaly present.  Cardiovascular: Normal rate, regular rhythm, normal heart sounds and intact distal pulses.  Exam reveals no gallop and no friction rub.   No murmur heard. Pulmonary/Chest: Effort normal and breath sounds normal. No respiratory distress. She has no wheezes. She has no rales.  Abdominal: Soft. She exhibits no  distension and no mass. There is tenderness (RUQ, LUQ, LLQ). There is no guarding.  High pitched bowel sounds.  Musculoskeletal: Normal range of motion. She exhibits no edema or tenderness.  Lymphadenopathy:    She has no cervical adenopathy.  Neurological: She is alert. Coordination normal.  Skin: Skin is warm and dry. No rash noted. She is not diaphoretic. No erythema.  Psychiatric: She has a normal mood and affect. Her behavior is normal.  Nursing note and vitals reviewed.  ED Course  Procedures  DIAGNOSTIC STUDIES:  Oxygen Saturation  is 96% on RA, adequate by my interpretation.    COORDINATION OF CARE:  8:14 PM Ordered X-ray and will administer pain medicine. Discussed treatment plan with pt at bedside and pt agreed to plan.  Labs Review Labs Reviewed  URINALYSIS, ROUTINE W REFLEX MICROSCOPIC (NOT AT [facility]) - Abnormal; Notable for the following:    APPearance CLOUDY (*)    All other components within normal limits  CBC WITH DIFFERENTIAL/PLATELET - Abnormal; Notable for the following:    RBC 3.80 (*)    Hemoglobin 11.5 (*)    HCT 35.2 (*)    All other components within normal limits  COMPREHENSIVE METABOLIC PANEL - Abnormal; Notable for the following:    Sodium 134 (*)    All other components within normal limits  PREGNANCY, URINE  LIPASE, BLOOD  I-STAT CG4 LACTIC ACID, ED  I-STAT CG4 LACTIC ACID, ED   Imaging Review Ct Abdomen Pelvis W Contrast  03/02/2015  CLINICAL DATA:  Left upper abdominal pain for 2 days, nausea, history of gastric bypass surgery EXAM: CT ABDOMEN AND PELVIS WITH CONTRAST TECHNIQUE: Multidetector CT imaging of the abdomen and pelvis was performed using the standard protocol following bolus administration of intravenous contrast. CONTRAST:  25mL OMNIPAQUE IOHEXOL 300 MG/ML SOLN, OMNIPAQUE IOHEXOL 300 MG/ML SOLN COMPARISON:  09/15/2005 no images available only the report FINDINGS: Lung bases are unremarkable. Sagittal images of the spine shows disc space flattening with vacuum disc phenomenon at L5-S1 level. There is mild fatty infiltration of the liver. Status post gastric bypass surgery. No focal hepatic mass. The pancreas, spleen and adrenal glands are unremarkable. Abdominal aorta is unremarkable. No calcified gallstones are noted within gallbladder. No aortic aneurysm.  There is no small bowel obstruction. Abundant stool is noted in right colon and transverse colon. Mild redundant transverse colon. There is moderate stool in descending colon and sigmoid colon. Moderate gas noted within  rectum. The uterus is anteflexed. Question small fibroid within right uterine body measures 1 cm. No adnexal mass. No calcified calculi are noted within urinary bladder. Bilateral distal ureter is unremarkable. Kidneys are symmetrical in size.  No hydronephrosis or hydroureter. Delayed renal images shows bilateral renal symmetrical excretion. Bilateral visualized proximal ureter is unremarkable. There is no evidence of pericecal inflammation. Normal appendix is partially visualized in axial image 51. IMPRESSION: 1. There is no evidence of acute inflammatory process within abdomen. 2. Mild fatty infiltration of the liver. 3. Status post gastric bypass surgery. 4. Abundant stool noted in right colon and transverse colon. Mild redundant transverse colon. Moderate stool noted in descending colon and sigmoid colon. Moderate gas noted within rectum. No colonic obstruction. No evidence of colitis. 5. Normal appendix is partially visualized. No pericecal inflammation. 6. No small bowel obstruction. 7. No hydronephrosis or hydroureter. 8. Question tiny fibroid in right uterine body measures 1 cm. Electronically Signed   By: Natasha Mead M.D.   On: 03/02/2015 22:04   I have  personally reviewed and evaluated these images and lab results as part of my medical decision-making.   MDM   Final diagnoses:  Left upper quadrant pain  Constipation, unspecified constipation type   Meds given in ED:  Medications  ondansetron (ZOFRAN) injection 4 mg (4 mg Intravenous Not Given 03/02/15 2108)  sodium chloride 0.9 % bolus 1,000 mL (0 mLs Intravenous Stopped 03/02/15 2235)  iohexol (OMNIPAQUE) 300 MG/ML solution 25 mL (25 mLs Oral Contrast Given 03/02/15 2130)  iohexol (OMNIPAQUE) 300 MG/ML solution 100 mL (100 mLs Intravenous Contrast Given 03/02/15 2130)    New Prescriptions   No medications on file      Has normal CT other than the constipation - pt informed Labs reviewed with pt - she has been updated and informed of  all results.  In addition to written d/c instructions, the pt was given verbal d/c instructions including the indications for return and expressed understanding to the instructions.  Bowel regimen recommended  I personally performed the services described in this documentation, which was scribed in my presence. The recorded information has been reviewed and is accurate.       Eber Hong, MD 03/02/15 [phone]

## 2015-03-02 NOTE — ED Notes (Signed)
Patient transported to CT 

## 2015-03-02 NOTE — ED Notes (Signed)
Abd pain x 2 days-concerned due to hx of gastric bypass (03/01/03) with complications/surgery x 2 after

## 2015-03-02 NOTE — Discharge Instructions (Signed)
Tylenol for pain - no more motrin Magnesium citrate - drink one 6 oz bottle tonight miralax twice daily until regular soft bowel movements daily.

## 2015-03-02 NOTE — ED Notes (Signed)
Patient in imaging and unable to get vital sings reassessed.

## 2015-03-06 DIAGNOSIS — F259 Schizoaffective disorder, unspecified: Secondary | ICD-10-CM | POA: Diagnosis not present

## 2015-03-08 DIAGNOSIS — F259 Schizoaffective disorder, unspecified: Secondary | ICD-10-CM | POA: Diagnosis not present

## 2015-03-20 ENCOUNTER — Other Ambulatory Visit: Payer: Self-pay

## 2015-03-20 DIAGNOSIS — Z9884 Bariatric surgery status: Secondary | ICD-10-CM | POA: Diagnosis not present

## 2015-03-20 DIAGNOSIS — F17210 Nicotine dependence, cigarettes, uncomplicated: Secondary | ICD-10-CM | POA: Diagnosis not present

## 2015-03-20 DIAGNOSIS — Z23 Encounter for immunization: Secondary | ICD-10-CM | POA: Diagnosis not present

## 2015-03-20 DIAGNOSIS — D509 Iron deficiency anemia, unspecified: Secondary | ICD-10-CM | POA: Diagnosis not present

## 2015-03-20 DIAGNOSIS — F209 Schizophrenia, unspecified: Secondary | ICD-10-CM | POA: Diagnosis not present

## 2015-03-20 DIAGNOSIS — J4520 Mild intermittent asthma, uncomplicated: Secondary | ICD-10-CM | POA: Diagnosis not present

## 2015-03-20 DIAGNOSIS — N951 Menopausal and female climacteric states: Secondary | ICD-10-CM | POA: Diagnosis not present

## 2015-03-20 DIAGNOSIS — R010 Benign and innocent cardiac murmurs: Secondary | ICD-10-CM | POA: Diagnosis not present

## 2015-03-20 DIAGNOSIS — Z0000 Encounter for general adult medical examination without abnormal findings: Secondary | ICD-10-CM | POA: Diagnosis not present

## 2015-03-21 DIAGNOSIS — Z1231 Encounter for screening mammogram for malignant neoplasm of breast: Secondary | ICD-10-CM | POA: Diagnosis not present

## 2015-03-23 DIAGNOSIS — H40233 Intermittent angle-closure glaucoma, bilateral: Secondary | ICD-10-CM | POA: Diagnosis not present

## 2015-03-23 DIAGNOSIS — H5203 Hypermetropia, bilateral: Secondary | ICD-10-CM | POA: Diagnosis not present

## 2015-03-23 DIAGNOSIS — H04123 Dry eye syndrome of bilateral lacrimal glands: Secondary | ICD-10-CM | POA: Diagnosis not present

## 2015-03-23 DIAGNOSIS — G43819 Other migraine, intractable, without status migrainosus: Secondary | ICD-10-CM | POA: Diagnosis not present

## 2015-03-23 DIAGNOSIS — Z83511 Family history of glaucoma: Secondary | ICD-10-CM | POA: Diagnosis not present

## 2015-03-23 DIAGNOSIS — H47323 Drusen of optic disc, bilateral: Secondary | ICD-10-CM | POA: Diagnosis not present

## 2015-03-23 DIAGNOSIS — H524 Presbyopia: Secondary | ICD-10-CM | POA: Diagnosis not present

## 2015-03-27 DIAGNOSIS — Z1231 Encounter for screening mammogram for malignant neoplasm of breast: Secondary | ICD-10-CM | POA: Diagnosis not present

## 2015-03-27 DIAGNOSIS — R922 Inconclusive mammogram: Secondary | ICD-10-CM | POA: Diagnosis not present

## 2015-03-27 DIAGNOSIS — R928 Other abnormal and inconclusive findings on diagnostic imaging of breast: Secondary | ICD-10-CM | POA: Diagnosis not present

## 2015-05-17 DIAGNOSIS — H40231 Intermittent angle-closure glaucoma, right eye: Secondary | ICD-10-CM | POA: Diagnosis not present

## 2015-06-28 DIAGNOSIS — H47323 Drusen of optic disc, bilateral: Secondary | ICD-10-CM | POA: Diagnosis not present

## 2015-06-28 DIAGNOSIS — H04123 Dry eye syndrome of bilateral lacrimal glands: Secondary | ICD-10-CM | POA: Diagnosis not present

## 2015-06-28 DIAGNOSIS — H40242 Residual stage of angle-closure glaucoma, left eye: Secondary | ICD-10-CM | POA: Diagnosis not present

## 2015-07-30 DIAGNOSIS — H40233 Intermittent angle-closure glaucoma, bilateral: Secondary | ICD-10-CM | POA: Diagnosis not present

## 2015-07-30 DIAGNOSIS — H40243 Residual stage of angle-closure glaucoma, bilateral: Secondary | ICD-10-CM | POA: Diagnosis not present

## 2015-09-21 ENCOUNTER — Ambulatory Visit
Admission: RE | Admit: 2015-09-21 | Discharge: 2015-09-21 | Disposition: A | Discharge status: Home / Self Care | Payer: Medicare Other | Source: Ambulatory Visit | Attending: Family Medicine | Admitting: Family Medicine

## 2015-09-21 ENCOUNTER — Other Ambulatory Visit: Payer: Self-pay | Admitting: Family Medicine

## 2015-09-21 DIAGNOSIS — R52 Pain, unspecified: Secondary | ICD-10-CM

## 2015-09-21 DIAGNOSIS — M25571 Pain in right ankle and joints of right foot: Secondary | ICD-10-CM | POA: Diagnosis not present

## 2015-10-09 DIAGNOSIS — M7662 Achilles tendinitis, left leg: Secondary | ICD-10-CM | POA: Diagnosis not present

## 2015-10-26 DIAGNOSIS — M7661 Achilles tendinitis, right leg: Secondary | ICD-10-CM | POA: Diagnosis not present

## 2015-10-26 DIAGNOSIS — M6702 Short Achilles tendon (acquired), left ankle: Secondary | ICD-10-CM | POA: Diagnosis not present

## 2015-10-31 DIAGNOSIS — H04123 Dry eye syndrome of bilateral lacrimal glands: Secondary | ICD-10-CM | POA: Diagnosis not present

## 2015-10-31 DIAGNOSIS — H4089 Other specified glaucoma: Secondary | ICD-10-CM | POA: Diagnosis not present

## 2015-10-31 DIAGNOSIS — H5712 Ocular pain, left eye: Secondary | ICD-10-CM | POA: Diagnosis not present

## 2015-10-31 DIAGNOSIS — H47323 Drusen of optic disc, bilateral: Secondary | ICD-10-CM | POA: Diagnosis not present

## 2015-10-31 DIAGNOSIS — H40243 Residual stage of angle-closure glaucoma, bilateral: Secondary | ICD-10-CM | POA: Diagnosis not present

## 2015-10-31 DIAGNOSIS — G43819 Other migraine, intractable, without status migrainosus: Secondary | ICD-10-CM | POA: Diagnosis not present

## 2015-11-29 ENCOUNTER — Ambulatory Visit (INDEPENDENT_AMBULATORY_CARE_PROVIDER_SITE_OTHER): Payer: Medicare Other | Admitting: Orthopedic Surgery

## 2015-11-29 DIAGNOSIS — M6702 Short Achilles tendon (acquired), left ankle: Secondary | ICD-10-CM

## 2015-11-29 DIAGNOSIS — M7661 Achilles tendinitis, right leg: Secondary | ICD-10-CM

## 2015-12-03 ENCOUNTER — Other Ambulatory Visit (INDEPENDENT_AMBULATORY_CARE_PROVIDER_SITE_OTHER): Payer: Self-pay

## 2015-12-03 MED ORDER — NITROGLYCERIN 0.2 MG/HR TD PT24: 0.2000 mg | MEDICATED_PATCH | Freq: Every day | TRANSDERMAL | 3 refills | Status: DC

## 2015-12-03 NOTE — Telephone Encounter (Signed)
I called and lm on vm for pharm to advise of refill.

## 2015-12-03 NOTE — Telephone Encounter (Signed)
Faxed refill request  from pharm.

## 2015-12-31 ENCOUNTER — Ambulatory Visit (INDEPENDENT_AMBULATORY_CARE_PROVIDER_SITE_OTHER): Payer: Medicare Other | Admitting: Orthopedic Surgery

## 2016-01-10 DIAGNOSIS — F259 Schizoaffective disorder, unspecified: Secondary | ICD-10-CM | POA: Diagnosis not present

## 2016-02-15 DIAGNOSIS — H40243 Residual stage of angle-closure glaucoma, bilateral: Secondary | ICD-10-CM | POA: Diagnosis not present

## 2016-02-15 DIAGNOSIS — H04123 Dry eye syndrome of bilateral lacrimal glands: Secondary | ICD-10-CM | POA: Diagnosis not present

## 2016-02-15 DIAGNOSIS — Z83511 Family history of glaucoma: Secondary | ICD-10-CM | POA: Diagnosis not present

## 2016-02-15 DIAGNOSIS — H4089 Other specified glaucoma: Secondary | ICD-10-CM | POA: Diagnosis not present

## 2016-02-15 DIAGNOSIS — H47323 Drusen of optic disc, bilateral: Secondary | ICD-10-CM | POA: Diagnosis not present

## 2016-02-15 DIAGNOSIS — H5203 Hypermetropia, bilateral: Secondary | ICD-10-CM | POA: Diagnosis not present

## 2016-03-14 DIAGNOSIS — R51 Headache: Secondary | ICD-10-CM | POA: Diagnosis not present

## 2016-03-14 DIAGNOSIS — R42 Dizziness and giddiness: Secondary | ICD-10-CM | POA: Diagnosis not present

## 2016-03-31 DIAGNOSIS — N951 Menopausal and female climacteric states: Secondary | ICD-10-CM | POA: Diagnosis not present

## 2016-03-31 DIAGNOSIS — J4520 Mild intermittent asthma, uncomplicated: Secondary | ICD-10-CM | POA: Diagnosis not present

## 2016-03-31 DIAGNOSIS — Z0000 Encounter for general adult medical examination without abnormal findings: Secondary | ICD-10-CM | POA: Diagnosis not present

## 2016-03-31 DIAGNOSIS — F17210 Nicotine dependence, cigarettes, uncomplicated: Secondary | ICD-10-CM | POA: Diagnosis not present

## 2016-03-31 DIAGNOSIS — R010 Benign and innocent cardiac murmurs: Secondary | ICD-10-CM | POA: Diagnosis not present

## 2016-03-31 DIAGNOSIS — Z9884 Bariatric surgery status: Secondary | ICD-10-CM | POA: Diagnosis not present

## 2016-03-31 DIAGNOSIS — F209 Schizophrenia, unspecified: Secondary | ICD-10-CM | POA: Diagnosis not present

## 2016-03-31 DIAGNOSIS — D509 Iron deficiency anemia, unspecified: Secondary | ICD-10-CM | POA: Diagnosis not present

## 2016-06-26 DIAGNOSIS — F259 Schizoaffective disorder, unspecified: Secondary | ICD-10-CM | POA: Diagnosis not present

## 2016-08-07 ENCOUNTER — Other Ambulatory Visit (INDEPENDENT_AMBULATORY_CARE_PROVIDER_SITE_OTHER): Payer: Self-pay | Admitting: Orthopedic Surgery

## 2016-08-27 DIAGNOSIS — Z83511 Family history of glaucoma: Secondary | ICD-10-CM | POA: Diagnosis not present

## 2016-08-27 DIAGNOSIS — H40243 Residual stage of angle-closure glaucoma, bilateral: Secondary | ICD-10-CM | POA: Diagnosis not present

## 2016-08-27 DIAGNOSIS — H04123 Dry eye syndrome of bilateral lacrimal glands: Secondary | ICD-10-CM | POA: Diagnosis not present

## 2016-08-27 DIAGNOSIS — H5203 Hypermetropia, bilateral: Secondary | ICD-10-CM | POA: Diagnosis not present

## 2016-08-27 DIAGNOSIS — H47323 Drusen of optic disc, bilateral: Secondary | ICD-10-CM | POA: Diagnosis not present

## 2016-08-27 DIAGNOSIS — H4089 Other specified glaucoma: Secondary | ICD-10-CM | POA: Diagnosis not present

## 2016-10-08 DIAGNOSIS — R51 Headache: Secondary | ICD-10-CM | POA: Diagnosis not present

## 2016-11-06 DIAGNOSIS — F259 Schizoaffective disorder, unspecified: Secondary | ICD-10-CM | POA: Diagnosis not present

## 2016-12-23 DIAGNOSIS — F259 Schizoaffective disorder, unspecified: Secondary | ICD-10-CM | POA: Diagnosis not present

## 2017-01-05 DIAGNOSIS — H47323 Drusen of optic disc, bilateral: Secondary | ICD-10-CM | POA: Diagnosis not present

## 2017-01-05 DIAGNOSIS — F259 Schizoaffective disorder, unspecified: Secondary | ICD-10-CM | POA: Diagnosis not present

## 2017-01-05 DIAGNOSIS — G43119 Migraine with aura, intractable, without status migrainosus: Secondary | ICD-10-CM | POA: Diagnosis not present

## 2017-04-08 ENCOUNTER — Other Ambulatory Visit: Payer: Self-pay | Admitting: Family Medicine

## 2017-04-08 ENCOUNTER — Other Ambulatory Visit (HOSPITAL_COMMUNITY)
Admission: RE | Admit: 2017-04-08 | Discharge: 2017-04-08 | Disposition: A | Discharge status: Home / Self Care | Payer: Medicare Other | Source: Ambulatory Visit | Attending: Family Medicine | Admitting: Family Medicine

## 2017-04-08 DIAGNOSIS — Z131 Encounter for screening for diabetes mellitus: Secondary | ICD-10-CM | POA: Diagnosis not present

## 2017-04-08 DIAGNOSIS — Z01419 Encounter for gynecological examination (general) (routine) without abnormal findings: Secondary | ICD-10-CM | POA: Diagnosis not present

## 2017-04-08 DIAGNOSIS — Z9884 Bariatric surgery status: Secondary | ICD-10-CM | POA: Diagnosis not present

## 2017-04-08 DIAGNOSIS — Z124 Encounter for screening for malignant neoplasm of cervix: Secondary | ICD-10-CM | POA: Diagnosis not present

## 2017-04-08 DIAGNOSIS — Z0000 Encounter for general adult medical examination without abnormal findings: Secondary | ICD-10-CM | POA: Diagnosis not present

## 2017-04-08 DIAGNOSIS — Z23 Encounter for immunization: Secondary | ICD-10-CM | POA: Diagnosis not present

## 2017-04-08 DIAGNOSIS — Z136 Encounter for screening for cardiovascular disorders: Secondary | ICD-10-CM | POA: Diagnosis not present

## 2017-04-08 DIAGNOSIS — F209 Schizophrenia, unspecified: Secondary | ICD-10-CM | POA: Diagnosis not present

## 2017-04-08 DIAGNOSIS — G43119 Migraine with aura, intractable, without status migrainosus: Secondary | ICD-10-CM | POA: Diagnosis not present

## 2017-04-08 DIAGNOSIS — Z1389 Encounter for screening for other disorder: Secondary | ICD-10-CM | POA: Diagnosis not present

## 2017-04-08 DIAGNOSIS — D509 Iron deficiency anemia, unspecified: Secondary | ICD-10-CM | POA: Diagnosis not present

## 2017-04-08 DIAGNOSIS — Z1159 Encounter for screening for other viral diseases: Secondary | ICD-10-CM | POA: Diagnosis not present

## 2017-04-08 DIAGNOSIS — G43819 Other migraine, intractable, without status migrainosus: Secondary | ICD-10-CM | POA: Diagnosis not present

## 2017-04-08 DIAGNOSIS — F259 Schizoaffective disorder, unspecified: Secondary | ICD-10-CM | POA: Diagnosis not present

## 2017-04-09 LAB — CYTOLOGY - PAP
CHLAMYDIA, DNA PROBE: NEGATIVE
DIAGNOSIS: NEGATIVE
NEISSERIA GONORRHEA: NEGATIVE

## 2017-06-16 DIAGNOSIS — F259 Schizoaffective disorder, unspecified: Secondary | ICD-10-CM | POA: Diagnosis not present

## 2017-07-10 DIAGNOSIS — F209 Schizophrenia, unspecified: Secondary | ICD-10-CM | POA: Diagnosis not present

## 2017-10-08 DIAGNOSIS — H52223 Regular astigmatism, bilateral: Secondary | ICD-10-CM | POA: Diagnosis not present

## 2017-10-08 DIAGNOSIS — H5203 Hypermetropia, bilateral: Secondary | ICD-10-CM | POA: Diagnosis not present

## 2017-10-08 DIAGNOSIS — H35363 Drusen (degenerative) of macula, bilateral: Secondary | ICD-10-CM | POA: Diagnosis not present

## 2017-10-08 DIAGNOSIS — H04123 Dry eye syndrome of bilateral lacrimal glands: Secondary | ICD-10-CM | POA: Diagnosis not present

## 2017-10-08 DIAGNOSIS — H524 Presbyopia: Secondary | ICD-10-CM | POA: Diagnosis not present

## 2017-10-08 DIAGNOSIS — H47323 Drusen of optic disc, bilateral: Secondary | ICD-10-CM | POA: Diagnosis not present

## 2017-10-08 DIAGNOSIS — H4089 Other specified glaucoma: Secondary | ICD-10-CM | POA: Diagnosis not present

## 2017-10-08 DIAGNOSIS — H40243 Residual stage of angle-closure glaucoma, bilateral: Secondary | ICD-10-CM | POA: Diagnosis not present

## 2017-10-08 DIAGNOSIS — Z83511 Family history of glaucoma: Secondary | ICD-10-CM | POA: Diagnosis not present

## 2017-12-29 ENCOUNTER — Emergency Department (HOSPITAL_BASED_OUTPATIENT_CLINIC_OR_DEPARTMENT_OTHER): Payer: 59

## 2017-12-29 ENCOUNTER — Other Ambulatory Visit: Payer: Self-pay

## 2017-12-29 ENCOUNTER — Emergency Department (HOSPITAL_BASED_OUTPATIENT_CLINIC_OR_DEPARTMENT_OTHER)
Admission: EM | Admit: 2017-12-29 | Discharge: 2017-12-29 | Disposition: A | Discharge status: Home / Self Care | Payer: 59 | Attending: Emergency Medicine | Admitting: Emergency Medicine

## 2017-12-29 ENCOUNTER — Encounter (HOSPITAL_BASED_OUTPATIENT_CLINIC_OR_DEPARTMENT_OTHER): Payer: Self-pay | Admitting: *Deleted

## 2017-12-29 DIAGNOSIS — Z79899 Other long term (current) drug therapy: Secondary | ICD-10-CM | POA: Diagnosis not present

## 2017-12-29 DIAGNOSIS — W230XXA Caught, crushed, jammed, or pinched between moving objects, initial encounter: Secondary | ICD-10-CM | POA: Insufficient documentation

## 2017-12-29 DIAGNOSIS — M79644 Pain in right finger(s): Secondary | ICD-10-CM

## 2017-12-29 DIAGNOSIS — Y929 Unspecified place or not applicable: Secondary | ICD-10-CM | POA: Diagnosis not present

## 2017-12-29 DIAGNOSIS — Y9389 Activity, other specified: Secondary | ICD-10-CM | POA: Insufficient documentation

## 2017-12-29 DIAGNOSIS — F17200 Nicotine dependence, unspecified, uncomplicated: Secondary | ICD-10-CM | POA: Insufficient documentation

## 2017-12-29 DIAGNOSIS — Y999 Unspecified external cause status: Secondary | ICD-10-CM | POA: Diagnosis not present

## 2017-12-29 HISTORY — DX: Migraine, unspecified, not intractable, without status migrainosus: G43.909

## 2017-12-29 NOTE — ED Triage Notes (Signed)
Her right thumb was shut in a door a month ago. She was never seen after the injury. Is has been wearing a splint with no improvement.

## 2017-12-29 NOTE — ED Provider Notes (Signed)
[facility] EMERGENCY DEPARTMENT Provider Note   CSN: [id number] Arrival date & time: 12/29/17  1226     History   Chief Complaint No chief complaint on file.   HPI Melinda Elliott is a 47 y.o. female presenting today for right thumb pain that began 1 month ago.  Patient states that she was getting out of her car when the wind pushed the door closed onto her left thumb.  Patient states that she had immediate pain, throbbing and moderate in intensity constant worsened with movement or palpation of the thumb.  Patient describes her pain as primarily along the MCP joint.  Patient states she has some minor swelling after that time however pain and swelling improved with rest and Tylenol.  Patient states that she has felt well over the past month however the last 3 days she has had increased pain to the left thumb.  Patient denies new injury or trauma to the area or swelling.  Patient states that she is an otherwise healthy 47 year old female without history of chronic medical conditions.  Patient denies history of diabetes, HIV or other immunocompromising conditions.  Patient denies history of fever, joint erythema, streaking, no increased swelling or color change.  HPI  Past Medical History:  Diagnosis Date  . Glaucoma   . Migraines     There are no active problems to display for this patient.   Past Surgical History:  Procedure Laterality Date  . ABDOMINAL SURGERY    . CESAREAN SECTION    . GASTRIC BYPASS    . HERNIA REPAIR    . LASIK       OB History   None      Home Medications    Prior to Admission medications   Medication Sig Start Date End Date Taking? Authorizing Provider  GABAPENTIN PO Take by mouth.    [provider]  Multiple Vitamin (MULTIVITAMIN) capsule Take 1 capsule by mouth daily.    [provider]  nitroGLYCERIN (NITRODUR - DOSED IN MG/24 HR) 0.2 mg/hr patch Place 0.2 patches onto the skin once. Apply one patch externally  to skin daily 10/26/15   Nadara Mustard, MD  nitroGLYCERIN (NITRODUR - DOSED IN MG/24 HR) 0.2 mg/hr patch UNWRAP AND APPLY 1 PATCH TO TO THE AFFECTED AREA OF SKIN DAILY IN A DIFFERENT LOCATION 08/07/16   Nadara Mustard, MD    Family History No family history on file.  Social History Social History   Tobacco Use  . Smoking status: Current Every Day Smoker  . Smokeless tobacco: Never Used  Substance Use Topics  . Alcohol use: Yes    Comment: occ  . Drug use: No     Allergies   Bee venom and Shrimp [shellfish allergy]   Review of Systems Review of Systems  Constitutional: Negative.  Negative for chills and fever.  Musculoskeletal: Positive for arthralgias (Right thumb pain.) and joint swelling.  Skin: Negative.  Negative for color change, rash and wound.  Neurological: Negative.  Negative for weakness and numbness.    Physical Exam Updated Vital Signs BP 113/75   Pulse 93   Temp 98.3 F (36.8 C) (Oral)   Resp 20   Ht 5' 5.5" (1.664 m)   Wt 83 kg   SpO2 100%   BMI 29.99 kg/m   Physical Exam  Constitutional: She is oriented to person, place, and time. She appears well-developed and well-nourished. No distress.  HENT:  Head: Normocephalic and atraumatic.  Right Ear:  External ear normal.  Left Ear: External ear normal.  Nose: Nose normal.  Eyes: Pupils are equal, round, and reactive to light. EOM are normal.  Neck: Trachea normal and normal range of motion. No tracheal deviation present.  Pulmonary/Chest: Effort normal. No respiratory distress.  Abdominal: Soft. There is no tenderness. There is no rebound and no guarding.  Musculoskeletal: Normal range of motion.       Right elbow: Normal.      Right wrist: Normal.       Right forearm: Normal.       Right hand: She exhibits tenderness. She exhibits normal range of motion, normal capillary refill and no deformity. Normal sensation noted. Normal strength noted.       Hands: Right hand: No gross deformities, skin  intact. Fingers appear normal.   Tenderness primarily to palpation over right thumb MCP.  Patient endorses mild snuffbox tenderness to palpation.   No tenderness to palpation over flexor sheath.  Finger adduction/abduction intact with 5/5 strength.  Thumb opposition intact. Full active and resisted ROM to flexion/extension at wrist, MCP, PIP and DIP of all fingers.  FDS/FDP intact. Grip 5/5 strength.  Radial artery 2+ with <2sec cap refill in all fingers.  Sensation intact to light-tough in median/ulnar/radial distributions.  Neurological: She is alert and oriented to person, place, and time.  Speech is clear and goal oriented, follows commands Major Cranial nerves without deficit, no facial droop Normal strength in upper extremities bilaterally including  strong and equal grip strength Sensation normal to light touch Moves extremities without ataxia, coordination intact Normal gait  Skin: Skin is warm and dry.  Psychiatric: She has a normal mood and affect. Her behavior is normal.   ED Treatments / Results  Labs (all labs ordered are listed, but only abnormal results are displayed) Labs Reviewed - No data to display  EKG None  Radiology Dg Hand Complete Left  Result Date: 12/29/2017 CLINICAL DATA:  Slammed right hand in car door 11/07/2017. Persistent pain along the first metacarpal. EXAM: LEFT HAND - COMPLETE 3+ VIEW COMPARISON:  None. FINDINGS: The joint spaces are maintained.  No acute fracture is identified. IMPRESSION: No acute bony findings. Electronically Signed   By: Rudie MeyerP.  Gallerani M.D.   On: 12/29/2017 14:21   Dg Hand Complete Right  Result Date: 12/29/2017 CLINICAL DATA:  Slammed right hand in car door.  Persistent pain. EXAM: RIGHT HAND - COMPLETE 3+ VIEW COMPARISON:  Right thumb radiographs 12/29/2017 FINDINGS: The joint spaces are maintained.  No acute fracture is identified. IMPRESSION: No acute bony findings. Electronically Signed   By: Rudie MeyerP.  Gallerani M.D.   On:  12/29/2017 14:23   Dg Finger Thumb Right  Result Date: 12/29/2017 CLINICAL DATA:  RIGHT thumb pain for 1 month after slamming thumb in car door EXAM: RIGHT THUMB 2+V COMPARISON:  None FINDINGS: Osseous mineralization normal. Joint spaces preserved. No fracture, dislocation, or bone destruction. IMPRESSION: Normal exam. Electronically Signed   By: Ulyses SouthwardMark  Boles M.D.   On: 12/29/2017 12:55    Procedures Procedures (including critical care time)  Medications Ordered in ED Medications - No data to display   Initial Impression / Assessment and Plan / ED Course  I have reviewed the triage vital signs and the nursing notes.  Pertinent labs & imaging results that were available during my care of the patient were reviewed by me and considered in my medical decision making (see chart for details).    47 year old female presenting today for 1  month of right first MCP joint pain.  Imaging today of the right thumb/hand negative.  Left hand radiography mistakenly ordered, also negative.  Patient's right hand is without swelling, erythema, increased warmth, induration, fluctuance, color change.  Patient is neurovascularly intact to bilateral upper extremities.  Capillary refill, sensation, movement and 5/5 strength to all movements.  No signs of trauma, infection/septic joint, DVT, compartment syndrome or neurovascular compromise.  Due to patient's area pain and mild snuffbox tenderness patient has been provided with a thumb spica splint here today.  Patient strongly encouraged to call hand specialist office today to schedule appointment for this week for further evaluation of her right thumb.  Patient informed that small unseen fractures may still be present and that the scaphoid bone should be further evaluated.  Patient states understanding to follow-up with hand surgeon this week.  Patient is afebrile, not tachycardic, not hypotensive well-appearing and in no acute distress.  At this time there does  not appear to be any evidence of an acute emergency medical condition and the patient appears stable for discharge with appropriate outpatient follow up. Diagnosis was discussed with patient who verbalizes understanding of care plan and is agreeable to discharge. I have discussed return precautions with patient who verbalizes understanding of return precautions. Patient strongly encouraged to follow-up with their Hand. All questions answered.   Note: Portions of this report may have been transcribed using voice recognition software. Every effort was made to ensure accuracy; however, inadvertent computerized transcription errors may still be present. Final Clinical Impressions(s) / ED Diagnoses   Final diagnoses:  Pain of right thumb    ED Discharge Orders    None       Elizabeth Palau 12/29/17 1456    Tegeler, Canary Brim, MD 12/29/17 [phone]

## 2017-12-29 NOTE — Discharge Instructions (Addendum)
You have been diagnosed today with Right Thumb Pain.  At this time there does not appear to be the presence of an emergent medical condition, however there is always the potential for conditions to change. Please read and follow the below instructions.  Please return to the Emergency Department immediately for any new or worsening symptoms or if your symptoms do not improve. Please be sure to follow up with your Primary Care Provider within 7 days regarding your visit today; please call their office to schedule an appointment even if you are feeling better for a follow-up visit. Due to the area of your pain it is very important for you to follow-up with the hand specialist THIS WEEK regarding your right thumb pain.  Please call Dr. Carlos LeveringGramig's office TODAY to schedule appointment. Please avoid use of your right thumb until you follow-up with the hand specialist, please continue to use the thumb splint for all activities to protect the small bones attached to your right thumb.  Get help right away if: Your hand becomes warm, red, or swollen. Your hand is numb or tingling. Your hand is extremely swollen or deformed. Your hand or fingers turn white or blue. You cannot move your hand, wrist, or fingers. You have fever  Please read the additional information packets attached to your discharge summary.  Do not take your medicine if  develop an itchy rash, swelling in your mouth or lips, or difficulty breathing.

## 2018-02-17 DIAGNOSIS — F259 Schizoaffective disorder, unspecified: Secondary | ICD-10-CM | POA: Diagnosis not present

## 2018-10-17 ENCOUNTER — Emergency Department (HOSPITAL_BASED_OUTPATIENT_CLINIC_OR_DEPARTMENT_OTHER)
Admission: EM | Admit: 2018-10-17 | Discharge: 2018-10-17 | Disposition: A | Discharge status: Home / Self Care | Payer: 59 | Attending: Emergency Medicine | Admitting: Emergency Medicine

## 2018-10-17 ENCOUNTER — Encounter (HOSPITAL_BASED_OUTPATIENT_CLINIC_OR_DEPARTMENT_OTHER): Payer: Self-pay | Admitting: Emergency Medicine

## 2018-10-17 ENCOUNTER — Other Ambulatory Visit: Payer: Self-pay

## 2018-10-17 DIAGNOSIS — X19XXXA Contact with other heat and hot substances, initial encounter: Secondary | ICD-10-CM | POA: Insufficient documentation

## 2018-10-17 DIAGNOSIS — Y999 Unspecified external cause status: Secondary | ICD-10-CM | POA: Insufficient documentation

## 2018-10-17 DIAGNOSIS — T22231A Burn of second degree of right upper arm, initial encounter: Secondary | ICD-10-CM | POA: Insufficient documentation

## 2018-10-17 DIAGNOSIS — T23191A Burn of first degree of multiple sites of right wrist and hand, initial encounter: Secondary | ICD-10-CM | POA: Diagnosis not present

## 2018-10-17 DIAGNOSIS — S4991XA Unspecified injury of right shoulder and upper arm, initial encounter: Secondary | ICD-10-CM | POA: Diagnosis present

## 2018-10-17 DIAGNOSIS — Y9389 Activity, other specified: Secondary | ICD-10-CM | POA: Insufficient documentation

## 2018-10-17 DIAGNOSIS — F17200 Nicotine dependence, unspecified, uncomplicated: Secondary | ICD-10-CM | POA: Insufficient documentation

## 2018-10-17 DIAGNOSIS — Y929 Unspecified place or not applicable: Secondary | ICD-10-CM | POA: Insufficient documentation

## 2018-10-17 DIAGNOSIS — T300 Burn of unspecified body region, unspecified degree: Secondary | ICD-10-CM

## 2018-10-17 DIAGNOSIS — Z23 Encounter for immunization: Secondary | ICD-10-CM | POA: Diagnosis not present

## 2018-10-17 MED ORDER — HYDROCODONE-ACETAMINOPHEN 5-325 MG PO TABS: 1.0000 | ORAL_TABLET | ORAL | 0 refills | Status: DC | PRN

## 2018-10-17 MED ORDER — TETANUS-DIPHTH-ACELL PERTUSSIS 5-2.5-18.5 LF-MCG/0.5 IM SUSP
0.5000 mL | Freq: Once | INTRAMUSCULAR | Status: AC
  Administered 2018-10-17: 0.5 mL via INTRAMUSCULAR
  Filled 2018-10-17: qty 0.5

## 2018-10-17 NOTE — ED Provider Notes (Signed)
[facility] Emergency Department Provider Note MRN:  [id number]  Arrival date & time: 10/17/18     Chief Complaint   Burn History of Present Illness   Melinda Elliott is a 48 y.o. year-old adult with no pertinent past medical history presenting to the ED with chief complaint of burn.  Patient was playing with her grandchild.  Using a hot glue gun.  The glue spilled onto her right hand and left arm.  Very painful.  Washed off and removed as much of the glue as possible, soaked in cold water.  Here for continued pain and subsequent burns.  Denies head trauma or loss consciousness.  No other complaints.  Pain in the left arm and right hand is moderate in severity, constant, worse with palpation.  Review of Systems  A complete 10 system review of systems was obtained and all systems are negative except as noted in the HPI and PMH.   Patient's Health History    Past Medical History:  Diagnosis Date   Glaucoma    Migraines     Past Surgical History:  Procedure Laterality Date   ABDOMINAL SURGERY     CESAREAN SECTION     GASTRIC BYPASS     HERNIA REPAIR     LASIK      History reviewed. No pertinent family history.  Social History   Socioeconomic History   Marital status: Unknown    Spouse name: Not on file   Number of children: Not on file   Years of education: Not on file   Highest education level: Not on file  Occupational History   Not on file  Social Needs   Financial resource strain: Not on file   Food insecurity    Worry: Not on file    Inability: Not on file   Transportation needs    Medical: Not on file    Non-medical: Not on file  Tobacco Use   Smoking status: Current Every Day Smoker   Smokeless tobacco: Never Used  Substance and Sexual Activity   Alcohol use: Yes    Comment: occ   Drug use: No   Sexual activity: Yes    Birth control/protection: Pill  Lifestyle   Physical activity    Days per week: Not  on file    Minutes per session: Not on file   Stress: Not on file  Relationships   Social connections    Talks on phone: Not on file    Gets together: Not on file    Attends religious service: Not on file    Active member of club or organization: Not on file    Attends meetings of clubs or organizations: Not on file    Relationship status: Not on file   Intimate partner violence    Fear of current or ex partner: Not on file    Emotionally abused: Not on file    Physically abused: Not on file    Forced sexual activity: Not on file  Other Topics Concern   Not on file  Social History Narrative   Not on file     Physical Exam  Vital Signs and Nursing Notes reviewed Vitals:   10/17/18 1508  BP: 125/86  Pulse: 75  Resp: 18  Temp: 98.5 F (36.9 C)  SpO2: 100%    CONSTITUTIONAL: Well-appearing, NAD NEURO:  Alert and oriented x 3, no focal deficits EYES:  eyes equal and reactive ENT/NECK:  no LAD, no JVD CARDIO:  Regular rate, well-perfused, normal S1 and S2 PULM:  CTAB no wheezing or rhonchi GI/GU:  normal bowel sounds, non-distended, non-tender MSK/SPINE:  No gross deformities, no edema SKIN: Scattered small superficial first-degree burns to the right hand with adhered glue; the left upper arm has a 3 cm ruptured blister, it is sensate.  More distally there is a field of first-degree burns and scattered dried glue.  The first-degree burn crosses the medial aspect of the antecubital fossa PSYCH:  Appropriate speech and behavior  Diagnostic and Interventional Summary    Labs Reviewed - No data to display  No orders to display    Medications  Tdap (BOOSTRIX) injection 0.5 mL (0.5 mLs Intramuscular Given 10/17/18 1628)     Procedures Critical Care  ED Course and Medical Decision Making  I have reviewed the triage vital signs and the nursing notes.  Pertinent labs & imaging results that were available during my care of the patient were reviewed by me and considered  in my medical decision making (see below for details).  Hot glue gun burn injury, estimated total body surface area of burn is between 1 and 2%.  Only small area of second-degree burn, the rest being first-degree burn.  The area of second-degree burn is sensate.  The burn is not circumferential, but it does cross the antecubital fossa.  This aspect and the risk of stricture of the skin is discussed with patient, and she agrees to follow-up with the burn specialists.  We will dressed with petroleum gauze here and provide prescription for pain control at home.  Barth Kirks. Sedonia Small, [place] [email or web address]  Final Clinical Impressions(s) / ED Diagnoses     ICD-10-CM   1. Burn  T30.0     ED Discharge Orders         Ordered    HYDROcodone-acetaminophen (NORCO/VICODIN) 5-325 MG tablet  Every 4 hours PRN     10/17/18 1637          Discharge Instructions Discussed with and Provided to Patient:   Discharge Instructions     You were evaluated in the Emergency Department and after careful evaluation, we did not find any emergent condition requiring admission or further testing in the hospital.  Your exam/testing today is overall reassuring.  As we discussed, we recommend that you follow-up with the [facility] to make sure that your burn heals properly.  Please call them Tuesday morning to set up an appointment.  The number is [phone].  At home we recommend daily dressing changes with the splint provided.  If you are having trouble sleeping due to the pain from the burns, you can use the Norco medication provided.  Please return to the Emergency Department if you experience any worsening of your condition.  We encourage you to follow up with a primary care provider.  Thank you for allowing [place] to be a part of your care.       Maudie Flakes, MD 10/17/18 [phone]

## 2018-10-17 NOTE — ED Notes (Signed)
Xeroform and kerlex applied to burn per EDP request. Pt tolerated well and given wound care instructions for home.

## 2018-10-17 NOTE — ED Notes (Signed)
ED Provider at bedside. 

## 2018-10-17 NOTE — Discharge Instructions (Addendum)
You were evaluated in the Emergency Department and after careful evaluation, we did not find any emergent condition requiring admission or further testing in the hospital.  Your exam/testing today is overall reassuring.  As we discussed, we recommend that you follow-up with the [facility] to make sure that your burn heals properly.  Please call them Tuesday morning to set up an appointment.  The number is [phone].  At home we recommend daily dressing changes with the splint provided.  If you are having trouble sleeping due to the pain from the burns, you can use the Norco medication provided.  Please return to the Emergency Department if you experience any worsening of your condition.  We encourage you to follow up with a primary care provider.  Thank you for allowing [place] to be a part of your care.

## 2018-10-17 NOTE — ED Triage Notes (Signed)
Pt c/o left arm burn from hot glue 1 hr pta. Arm wrapped in gauze pta. Pt states redness, peeling skin and blisters

## 2018-12-08 ENCOUNTER — Other Ambulatory Visit: Payer: Self-pay | Admitting: Obstetrics & Gynecology

## 2019-06-20 ENCOUNTER — Other Ambulatory Visit: Payer: Self-pay | Admitting: Obstetrics & Gynecology

## 2019-06-20 DIAGNOSIS — Z1231 Encounter for screening mammogram for malignant neoplasm of breast: Secondary | ICD-10-CM

## 2019-06-27 ENCOUNTER — Ambulatory Visit
Admission: RE | Admit: 2019-06-27 | Discharge: 2019-06-27 | Disposition: A | Discharge status: Home / Self Care | Payer: Medicare Other | Source: Ambulatory Visit | Attending: Obstetrics & Gynecology | Admitting: Obstetrics & Gynecology

## 2019-06-27 ENCOUNTER — Other Ambulatory Visit: Payer: Self-pay

## 2019-06-27 DIAGNOSIS — Z1231 Encounter for screening mammogram for malignant neoplasm of breast: Secondary | ICD-10-CM

## 2019-07-28 ENCOUNTER — Encounter (HOSPITAL_BASED_OUTPATIENT_CLINIC_OR_DEPARTMENT_OTHER): Payer: Self-pay | Admitting: Obstetrics & Gynecology

## 2019-07-28 ENCOUNTER — Other Ambulatory Visit: Payer: Self-pay

## 2019-07-29 ENCOUNTER — Encounter (HOSPITAL_BASED_OUTPATIENT_CLINIC_OR_DEPARTMENT_OTHER)
Admission: RE | Admit: 2019-07-29 | Discharge: 2019-07-29 | Disposition: A | Discharge status: Home / Self Care | Payer: 59 | Source: Ambulatory Visit | Attending: Obstetrics & Gynecology | Admitting: Obstetrics & Gynecology

## 2019-07-29 ENCOUNTER — Other Ambulatory Visit (HOSPITAL_COMMUNITY)
Admission: RE | Admit: 2019-07-29 | Discharge: 2019-07-29 | Disposition: A | Discharge status: Home / Self Care | Payer: 59 | Source: Ambulatory Visit | Attending: Obstetrics & Gynecology | Admitting: Obstetrics & Gynecology

## 2019-07-29 DIAGNOSIS — F209 Schizophrenia, unspecified: Secondary | ICD-10-CM | POA: Diagnosis not present

## 2019-07-29 DIAGNOSIS — Z20822 Contact with and (suspected) exposure to covid-19: Secondary | ICD-10-CM | POA: Diagnosis not present

## 2019-07-29 DIAGNOSIS — J45909 Unspecified asthma, uncomplicated: Secondary | ICD-10-CM | POA: Diagnosis not present

## 2019-07-29 DIAGNOSIS — H409 Unspecified glaucoma: Secondary | ICD-10-CM | POA: Diagnosis not present

## 2019-07-29 DIAGNOSIS — N950 Postmenopausal bleeding: Secondary | ICD-10-CM | POA: Diagnosis present

## 2019-07-29 DIAGNOSIS — Z79899 Other long term (current) drug therapy: Secondary | ICD-10-CM | POA: Diagnosis not present

## 2019-07-29 DIAGNOSIS — Z01812 Encounter for preprocedural laboratory examination: Secondary | ICD-10-CM | POA: Insufficient documentation

## 2019-07-29 DIAGNOSIS — Z9884 Bariatric surgery status: Secondary | ICD-10-CM | POA: Diagnosis not present

## 2019-07-29 DIAGNOSIS — F329 Major depressive disorder, single episode, unspecified: Secondary | ICD-10-CM | POA: Diagnosis not present

## 2019-07-29 DIAGNOSIS — Z7951 Long term (current) use of inhaled steroids: Secondary | ICD-10-CM | POA: Diagnosis not present

## 2019-07-29 DIAGNOSIS — F17210 Nicotine dependence, cigarettes, uncomplicated: Secondary | ICD-10-CM | POA: Diagnosis not present

## 2019-07-29 LAB — CBC
HCT: 34.7 % — ABNORMAL LOW (ref 36.0–46.0)
Hemoglobin: 11.5 g/dL — ABNORMAL LOW (ref 12.0–15.0)
MCH: 29.9 pg (ref 26.0–34.0)
MCHC: 33.1 g/dL (ref 30.0–36.0)
MCV: 90.1 fL (ref 80.0–100.0)
Platelets: 324 10*3/uL (ref 150–400)
RBC: 3.85 MIL/uL — ABNORMAL LOW (ref 3.87–5.11)
RDW: 12.3 % (ref 11.5–15.5)
WBC: 4.7 10*3/uL (ref 4.0–10.5)
nRBC: 0 % (ref 0.0–0.2)

## 2019-07-29 LAB — SARS CORONAVIRUS 2 (TAT 6-24 HRS): SARS Coronavirus 2: NEGATIVE

## 2019-07-29 LAB — ABO/RH: ABO/RH(D): B POS

## 2019-07-29 NOTE — Progress Notes (Signed)

## 2019-07-31 NOTE — H&P (Signed)
49yo PM female who presents for HSC/D&C due to postmenopausal bleeding.  In review: Back in Nov 2020 she reported that she had gone a full year without a period.  Then, she had 2 mos of a "period" with heavy bleeding lasting for about 8-10 days.  On a heavy day she may use 6 ultra tampons per day.  Also noted considerable pelvic pain.  She then had skipped several month and then in May had considerable pelvic pain no bleeding, but then in June had moderate to heavy bleeding.    Work up completed: EMB: 11/2018 atrophic endometrium [place] 12/23/2018: 7.4cm anteverted uterus- normal size and shape.  Thin endometrium -0.25cm.  Following SHG- no masses seen  Hgb 12 (11/2018) FSH 45- 07/2017 Last pap 03/2017 neg   Current Medications  Taking  .Advair Diskus(Fluticasone-Salmeterol) 100-50 MCG/DOSE Aerosol Powder Breath Activated 1 puff Inhalation Twice a day    .EpiPen(EPINEPHrine) 0.3 MG/0.3ML Device as directed Injection as directed    .Calcium 600-200 MG-UNIT Tablet 2 tablets Orally Once a day    .Clorazepate Dipotassium 3.75 MG Tablet 1 tablet Orally once a day if needed    .DiphenhydrAMINE HCl 25 MG Tablet 1 tablet Orally as needed    .Flonase(Fluticasone Propionate) 50 MCG/ACT Suspension 1 spray in each nostril Nasally Once a day    .Gabapentin 300 MG Capsule 1 capsule Orally Three times a day    .ProAir HFA(Albuterol Sulfate HFA) 108 (90 Base) MCG/ACT Aerosol Solution 2 puffs as needed Inhalation three times a day    .Simbrinza(Brinzolamide-Brimonidine) 1-0.2 % Suspension 1 drop into affected eye Ophthalmic Three times a day    .Flexeril(Cyclobenzaprine HCl) 10 MG Tablet 1 tablet as needed Orally at bedtime as needed for muscle pain    .Ondansetron HCl 4 MG Tablet 1 tablet as needed Orally every 4 hrs    .Tylenol 8 Hour Arthritis Pain(Acetaminophen ER) 650 MG Tablet Extended Release 2 tablets as needed Orally every 8 hrs    .Magnesium Citrate 1.745 GM/30ML Solution  Orally once a month as needed    .Turmeric 500 MG Capsule 1 capsule Orally once a day    .Vitamin B Complex Tablet 1 tablet Orally Once a day    .Vitamin E 400 UNIT Capsule 1 capsule Orally Once a day   Past Medical History        Depression.         Heart murmur.         Asthma.         gastric bypass - 2005 in [place].         Recurrent abdominal pain - US abdomen - Gallstones, 8/07, CT revealed internal hernia S/P repair 8/07 in [place].         Headaches - h/o pseudotumor cerebrii [facility]).         glaucoma - Opth in [place].         schizoaffective d/o [facility]).  Surgical History        S/P Gastric Bypass 1/05        tubal ligation         inguinal hernia repair 8/07        R Thumb trigger repair - Dr Janee Morn 11/2018       Family History   Father: alive, heart disease, meniere's disease, diagnosed with Hypertension, Prostate CA   Mother: alive, diagnosed with Hypertension   Mother L&W.       Social History  General:  Tobacco use             cigarettes: Current smoker           Frequency: 1/2 PPD           Tobacco history last updated 07/20/2019      EXPOSURE TO PASSIVE SMOKE: yes, 8-9 cigarettes per day.       Alcohol: yes, 1-2 per week.       Caffeine: yes, very little.       no Recreational drug use.       Exercise: yes, cardio.       Marital Status: single, Divorced.       Children: Boys, 1, girls, 1.       OCCUPATION: employed, Secretary/administrator.       Seat belt use: yes.      Gyn History  Sexual activity currently sexually active.   Birth control tubal ligation .   Last pap smear date 03-2017-negative.   Last mammogram date 03-2015- Cat I.   Denies H/O Abnormal pap smear.   STD Gonorrhea.   Menarche 42.   GYN procedures tubal ligation .       OB History  Number of pregnancies 2.   Pregnancy # 1 live birth, boy.   Pregnancy # 2 live birth, girl.       Allergies   Bee Sting: hives - Allergy   Shrimp (Diagnostic):  hives - Allergy       Hospitalization/Major Diagnostic Procedure   Denies Past Hospitalization       Review of Systems  CONSTITUTIONAL:         no Chills. no Fatigue. no Fever. Night sweats yes.     HEENT:         Blurrred vision no. no Double vision.     CARDIOLOGY:         no Chest pain.     RESPIRATORY:         no Shortness of breath. no Cough.     UROLOGY:         Urinary urgency yes. no Urinary frequency. no Urinary incontinence.     GASTROENTEROLOGY:         Abdominal pain yes. no Appetite change. no Change in bowel movements.     FEMALE REPRODUCTIVE:         See HPI for details. no Breast lumps or discharge. no Breast pain.     NEUROLOGY:         no Dizziness. no Headache. no Loss of consciousness. Tingling/numbness yes.     PSYCHOLOGY:         no Anxiety. no Depression.     SKIN:         no Rash. no Hives.     HEMATOLOGY/LYMPH:         no Anemia. Using Blood Thinners no.         Exam in office: Vital Signs  Wt 202.2, Wt change 1.8 lb, Ht 65, BMI 33.64, Temp 98.1, Pulse sitting 87, BP sitting 114/76.    General Examination:       CONSTITUTIONAL: well developed, well nourished.        SKIN: warm and dry, no rashes.        NECK: supple, normal appearance.        LUNGS:  regular breathing rate and effort.        HEART: no murmurs, regular rate and rhythm.        ABDOMEN:  soft and not tender, no masses palpated, no rebound, no rigidity.        MUSCULOSKELETAL  no calf tenderness bilaterally.        EXTREMITIES:  no edema present.        PSYCH:  appropriate mood and affect.     A/P: 49yo PM female who presents for hysteroscopy, D&C due to postmenopausal bleeding -NPO -LR @ 125cc/hr -SCDs to OR Reviewed risk/benefit and alternatives to the procedure. Reviewed risk of bleeding, infection and potential for uterine perforation with futher intervention. Questions and concerns were addressed and reviewed recovery and next step dependent upon pathology  Myna Hidalgo, DO [phone] (cell) [phone] (office)

## 2019-08-02 ENCOUNTER — Encounter (HOSPITAL_BASED_OUTPATIENT_CLINIC_OR_DEPARTMENT_OTHER): Payer: Self-pay | Admitting: Obstetrics & Gynecology

## 2019-08-02 ENCOUNTER — Ambulatory Visit (HOSPITAL_BASED_OUTPATIENT_CLINIC_OR_DEPARTMENT_OTHER): Payer: 59 | Admitting: Certified Registered"

## 2019-08-02 ENCOUNTER — Ambulatory Visit (HOSPITAL_BASED_OUTPATIENT_CLINIC_OR_DEPARTMENT_OTHER)
Admission: RE | Admit: 2019-08-02 | Discharge: 2019-08-02 | Disposition: A | Discharge status: Home / Self Care | Payer: 59 | Attending: Obstetrics & Gynecology | Admitting: Obstetrics & Gynecology

## 2019-08-02 ENCOUNTER — Other Ambulatory Visit: Payer: Self-pay

## 2019-08-02 ENCOUNTER — Encounter (HOSPITAL_BASED_OUTPATIENT_CLINIC_OR_DEPARTMENT_OTHER)
Admission: RE | Disposition: A | Discharge status: Home / Self Care | Payer: Self-pay | Source: Home / Self Care | Attending: Obstetrics & Gynecology

## 2019-08-02 DIAGNOSIS — N950 Postmenopausal bleeding: Secondary | ICD-10-CM | POA: Insufficient documentation

## 2019-08-02 DIAGNOSIS — Z79899 Other long term (current) drug therapy: Secondary | ICD-10-CM | POA: Insufficient documentation

## 2019-08-02 DIAGNOSIS — F329 Major depressive disorder, single episode, unspecified: Secondary | ICD-10-CM | POA: Insufficient documentation

## 2019-08-02 DIAGNOSIS — Z7951 Long term (current) use of inhaled steroids: Secondary | ICD-10-CM | POA: Insufficient documentation

## 2019-08-02 DIAGNOSIS — F17210 Nicotine dependence, cigarettes, uncomplicated: Secondary | ICD-10-CM | POA: Insufficient documentation

## 2019-08-02 DIAGNOSIS — H409 Unspecified glaucoma: Secondary | ICD-10-CM | POA: Insufficient documentation

## 2019-08-02 DIAGNOSIS — Z01812 Encounter for preprocedural laboratory examination: Secondary | ICD-10-CM | POA: Diagnosis not present

## 2019-08-02 DIAGNOSIS — F209 Schizophrenia, unspecified: Secondary | ICD-10-CM | POA: Diagnosis not present

## 2019-08-02 DIAGNOSIS — Z9884 Bariatric surgery status: Secondary | ICD-10-CM | POA: Insufficient documentation

## 2019-08-02 DIAGNOSIS — J45909 Unspecified asthma, uncomplicated: Secondary | ICD-10-CM | POA: Insufficient documentation

## 2019-08-02 HISTORY — PX: HYSTEROSCOPY WITH D & C: SHX1775

## 2019-08-02 HISTORY — DX: Schizoaffective disorder, unspecified: F25.9

## 2019-08-02 LAB — POCT PREGNANCY, URINE: Preg Test, Ur: NEGATIVE

## 2019-08-02 SURGERY — DILATATION AND CURETTAGE /HYSTEROSCOPY
Anesthesia: General | Site: Vagina

## 2019-08-02 MED ORDER — LIDOCAINE-EPINEPHRINE 1 %-1:100000 IJ SOLN
INTRAMUSCULAR | Status: DC | PRN
  Administered 2019-08-02: 20 mL via INTRADERMAL

## 2019-08-02 MED ORDER — LIDOCAINE 2% (20 MG/ML) 5 ML SYRINGE
INTRAMUSCULAR | Status: DC | PRN
  Administered 2019-08-02: 100 mg via INTRAVENOUS

## 2019-08-02 MED ORDER — HYDROMORPHONE HCL 1 MG/ML IJ SOLN
0.2500 mg | INTRAMUSCULAR | Status: DC | PRN
  Administered 2019-08-02: 0.5 mg via INTRAVENOUS

## 2019-08-02 MED ORDER — FENTANYL CITRATE (PF) 100 MCG/2ML IJ SOLN
INTRAMUSCULAR | Status: DC | PRN
  Administered 2019-08-02 (×2): 50 ug via INTRAVENOUS

## 2019-08-02 MED ORDER — FENTANYL CITRATE (PF) 100 MCG/2ML IJ SOLN
INTRAMUSCULAR | Status: AC
  Filled 2019-08-02: qty 2

## 2019-08-02 MED ORDER — SODIUM CHLORIDE 0.9 % IR SOLN
Status: DC | PRN
  Administered 2019-08-02: 1000 mL

## 2019-08-02 MED ORDER — ONDANSETRON HCL 4 MG/2ML IJ SOLN: 4.0000 mg | Freq: Once | INTRAMUSCULAR | Status: DC | PRN

## 2019-08-02 MED ORDER — LIDOCAINE-EPINEPHRINE (PF) 1 %-1:200000 IJ SOLN
INTRAMUSCULAR | Status: AC
  Filled 2019-08-02: qty 30

## 2019-08-02 MED ORDER — MIDAZOLAM HCL 2 MG/2ML IJ SOLN
INTRAMUSCULAR | Status: AC
  Filled 2019-08-02: qty 2

## 2019-08-02 MED ORDER — LIDOCAINE-EPINEPHRINE 1 %-1:100000 IJ SOLN
INTRAMUSCULAR | Status: AC
  Filled 2019-08-02: qty 1

## 2019-08-02 MED ORDER — DEXAMETHASONE SODIUM PHOSPHATE 4 MG/ML IJ SOLN
INTRAMUSCULAR | Status: DC | PRN
  Administered 2019-08-02: 5 mg via INTRAVENOUS

## 2019-08-02 MED ORDER — LACTATED RINGERS IV SOLN: INTRAVENOUS | Status: DC

## 2019-08-02 MED ORDER — PROPOFOL 10 MG/ML IV BOLUS
INTRAVENOUS | Status: DC | PRN
  Administered 2019-08-02: 150 mg via INTRAVENOUS

## 2019-08-02 MED ORDER — MIDAZOLAM HCL 5 MG/5ML IJ SOLN
INTRAMUSCULAR | Status: DC | PRN
  Administered 2019-08-02: 1 mg via INTRAVENOUS

## 2019-08-02 MED ORDER — HYDROMORPHONE HCL 1 MG/ML IJ SOLN
INTRAMUSCULAR | Status: AC
  Filled 2019-08-02: qty 0.5

## 2019-08-02 MED ORDER — ONDANSETRON HCL 4 MG/2ML IJ SOLN
INTRAMUSCULAR | Status: DC | PRN
  Administered 2019-08-02: 4 mg via INTRAVENOUS

## 2019-08-02 MED ORDER — MEPERIDINE HCL 25 MG/ML IJ SOLN: 6.2500 mg | INTRAMUSCULAR | Status: DC | PRN

## 2019-08-02 MED ORDER — SILVER NITRATE-POT NITRATE 75-25 % EX MISC
CUTANEOUS | Status: AC
  Filled 2019-08-02: qty 10

## 2019-08-02 SURGICAL SUPPLY — 18 items
CATH ROBINSON RED A/P 16FR (CATHETERS) IMPLANT
DEVICE MYOSURE LITE (MISCELLANEOUS) IMPLANT
DEVICE MYOSURE REACH (MISCELLANEOUS) IMPLANT
DILATOR CANAL MILEX (MISCELLANEOUS) IMPLANT
GAUZE 4X4 16PLY RFD (DISPOSABLE) ×4 IMPLANT
GLOVE BIOGEL PI IND STRL 6.5 (GLOVE) ×2 IMPLANT
GLOVE BIOGEL PI IND STRL 7.0 (GLOVE) ×2 IMPLANT
GLOVE BIOGEL PI INDICATOR 6.5 (GLOVE) ×2
GLOVE BIOGEL PI INDICATOR 7.0 (GLOVE) ×2
GLOVE ECLIPSE 6.5 STRL STRAW (GLOVE) ×4 IMPLANT
GOWN STRL REUS W/TWL LRG LVL3 (GOWN DISPOSABLE) ×8 IMPLANT
KIT PROCEDURE FLUENT (KITS) ×4 IMPLANT
PACK VAGINAL MINOR WOMEN LF (CUSTOM PROCEDURE TRAY) ×4 IMPLANT
PAD OB MATERNITY 4.3X12.25 (PERSONAL CARE ITEMS) ×4 IMPLANT
PAD PREP 24X48 CUFFED NSTRL (MISCELLANEOUS) ×4 IMPLANT
SEAL ROD LENS SCOPE MYOSURE (ABLATOR) ×4 IMPLANT
SLEEVE SCD COMPRESS KNEE MED (MISCELLANEOUS) ×4 IMPLANT
TOWEL GREEN STERILE FF (TOWEL DISPOSABLE) ×8 IMPLANT

## 2019-08-02 NOTE — Anesthesia Procedure Notes (Signed)
Procedure Name: LMA Insertion Date/Time: 08/02/2019 12:45 PM Performed by: Ronnette Hila, CRNA Pre-anesthesia Checklist: Patient identified, Emergency Drugs available, Suction available and Patient being monitored Patient Re-evaluated:Patient Re-evaluated prior to induction Oxygen Delivery Method: Circle system utilized Preoxygenation: Pre-oxygenation with 100% oxygen Induction Type: IV induction Ventilation: Mask ventilation without difficulty LMA: LMA inserted LMA Size: 4.0 Number of attempts: 1 Airway Equipment and Method: Bite block Placement Confirmation: positive ETCO2 Tube secured with: Tape Dental Injury: Teeth and Oropharynx as per pre-operative assessment

## 2019-08-02 NOTE — Transfer of Care (Signed)
Immediate Anesthesia Transfer of Care Note  Patient: Melinda Elliott  Procedure(s) Performed: DILATATION AND CURETTAGE /HYSTEROSCOPY (N/A Vagina )  Patient Location: PACU  Anesthesia Type:General  Level of Consciousness: awake, alert  and oriented  Airway & Oxygen Therapy: Patient Spontanous Breathing and Patient connected to face mask oxygen  Post-op Assessment: Report given to RN and Post -op Vital signs reviewed and stable  Post vital signs: Reviewed and stable  Last Vitals:  Vitals Value Taken Time  BP    Temp    Pulse    Resp    SpO2      Last Pain:  Vitals:   08/02/19 1141  TempSrc: Oral  PainSc: 4       Patients Stated Pain Goal: 5 (08/02/19 1141)  Complications: No complications documented.

## 2019-08-02 NOTE — Anesthesia Preprocedure Evaluation (Signed)
Anesthesia Evaluation  Patient identified by MRN, date of birth, ID band Patient awake    Reviewed: Allergy & Precautions, NPO status , Patient's Chart, lab work & pertinent test results  Airway Mallampati: I  TM Distance: >3 FB Neck ROM: Full    Dental   Pulmonary Current Smoker,    Pulmonary exam normal        Cardiovascular Normal cardiovascular exam     Neuro/Psych Schizophrenia    GI/Hepatic   Endo/Other    Renal/GU      Musculoskeletal   Abdominal   Peds  Hematology   Anesthesia Other Findings   Reproductive/Obstetrics                             Anesthesia Physical Anesthesia Plan  ASA: III  Anesthesia Plan: General   Post-op Pain Management:    Induction: Intravenous  PONV Risk Score and Plan: 2 and Ondansetron and Midazolam  Airway Management Planned: LMA  Additional Equipment:   Intra-op Plan:   Post-operative Plan: Extubation in OR  Informed Consent: I have reviewed the patients History and Physical, chart, labs and discussed the procedure including the risks, benefits and alternatives for the proposed anesthesia with the patient or authorized representative who has indicated his/her understanding and acceptance.       Plan Discussed with: Surgeon and CRNA  Anesthesia Plan Comments:         Anesthesia Quick Evaluation

## 2019-08-02 NOTE — Anesthesia Postprocedure Evaluation (Signed)
Anesthesia Post Note  Patient: [name]  Procedure(s) Performed: DILATATION AND CURETTAGE /HYSTEROSCOPY (N/A Vagina )     Patient location during evaluation: PACU Anesthesia Type: General Level of consciousness: awake and alert Pain management: pain level controlled Vital Signs Assessment: post-procedure vital signs reviewed and stable Respiratory status: spontaneous breathing, nonlabored ventilation, respiratory function stable and patient connected to nasal cannula oxygen Cardiovascular status: blood pressure returned to baseline and stable Postop Assessment: no apparent nausea or vomiting Anesthetic complications: no   No complications documented.  Last Vitals:  Vitals:   08/02/19 1336 08/02/19 1406  BP:  111/65  Pulse: 72 69  Resp: 20 16  Temp:  36.4 C  SpO2: 100% 100%    Last Pain:  Vitals:   08/02/19 1406  TempSrc: Oral  PainSc: 0-No pain                 Alessander Sikorski DAVID

## 2019-08-02 NOTE — Discharge Instructions (Signed)
  Post Anesthesia Home Care Instructions  Activity: Get plenty of rest for the remainder of the day. A responsible individual must stay with you for 24 hours following the procedure.  For the next 24 hours, DO NOT: -Drive a car -Advertising copywriter -Drink alcoholic beverages -Take any medication unless instructed by your physician -Make any legal decisions or sign important papers.  Meals: Start with liquid foods such as gelatin or soup. Progress to regular foods as tolerated. Avoid greasy, spicy, heavy foods. If nausea and/or vomiting occur, drink only clear liquids until the nausea and/or vomiting subsides. Call your physician if vomiting continues.  Special Instructions/Symptoms: Your throat may feel dry or sore from the anesthesia or the breathing tube placed in your throat during surgery. If this causes discomfort, gargle with warm salt water. The discomfort should disappear within 24 hours.  HOME INSTRUCTIONS  Please note any unusual or excessive bleeding, pain, swelling. Mild dizziness or drowsiness are normal for about 24 hours after surgery.   Shower when comfortable  Restrictions: No driving for 24 hours or while taking pain medications.  Activity:  No heavy lifting (> 10 lbs), nothing in vagina (no tampons, douching, or intercourse) x 2 weeks; no tub baths for 2 weeks Vaginal spotting is expected but if your bleeding is heavy, period like,  please call the office   Diet:  You may return to your regular diet.  Do not eat large meals.  Eat small frequent meals throughout the day.  Continue to drink a good amount of water at least 6-8 glasses of water per day, hydration is very important for the healing process.  Pain Management: Take Motrin and/or tylenol over the counter if needed.   Alcohol -- Avoid for 24 hours and while taking pain medications.  Nausea: Take sips of ginger ale or soda  Fever -- Call physician if temperature over 101 degrees  Follow up:  If you do not  already have a follow up appointment scheduled, please call the office at [phone].  If you experience fever (a temperature greater than 100.4), pain unrelieved by pain medication, shortness of breath, swelling of a single leg, or any other symptoms which are concerning to you please the office immediately.

## 2019-08-02 NOTE — Op Note (Signed)
Operative Report  PreOp: postmenopausal bleeding PostOp: same Procedure:  Hysteroscopy, Dilation and Curettage Surgeon: Dr. Myna Hidalgo Anesthesia: General, cervical block Complications:none EBL: 5cc  Discrepancy: 100cc  Findings:9cm anteverted uterus with proliferative endometrium.  Bilateral ostia seen.   Specimens: endometrial curettings  Procedure: The patient was taken to the operating room where she underwent general anesthesia without difficulty. The patient was placed in a low lithotomy position using Allen stirrups. The patient was examined with the findings as noted above.  She was then prepped and draped in the normal sterile fashion. A sterile speculum was inserted into the vagina.Cervix was injected with 20cc of 1% lidocaine with epi for a cervical block.  A single tooth tenaculum was placed on the anterior lip of the cervix. The uterus was then sounded to 9cm. The endocervical canal was then serially dilated using Hank dilators.  The diagnostic hysteroscope was then inserted without difficulty and noted to have the findings as listed above- no discrete abnormality was noted. UVisualization was achieved using NS as a distending medium. The hysteroscope was removed and sharp curettage was performed. The tissue was sent to pathology. The hysteroscope was reinserted no uterine perforation was seen. All instrument were then removed. Hemostasis was observed at the cervical site. The patient was repositioned to the supine position. The patient tolerated the procedure without any complications and taken to recovery in stable condition.   Myna Hidalgo, DO [phone] (pager) [phone] (office)

## 2019-08-02 NOTE — Interval H&P Note (Signed)
History and Physical Interval Note:  08/02/2019 12:18 PM  Melinda Elliott  has presented today for surgery, with the diagnosis of N95.0 postmenopausal bleeding.  The various methods of treatment have been discussed with the patient and family. After consideration of risks, benefits and other options for treatment, the patient has consented to  Procedure(s): DILATATION & CURETTAGE/HYSTEROSCOPY WITH MYOSURE (N/A) as a surgical intervention.  The patient's history has been reviewed, patient examined, no change in status, stable for surgery.  I have reviewed the patient's chart and labs.  Questions were answered to the patient's satisfaction.     Sharon Seller

## 2019-08-03 ENCOUNTER — Encounter (HOSPITAL_BASED_OUTPATIENT_CLINIC_OR_DEPARTMENT_OTHER): Payer: Self-pay | Admitting: Obstetrics & Gynecology

## 2019-08-03 LAB — SURGICAL PATHOLOGY

## 2020-03-20 ENCOUNTER — Other Ambulatory Visit: Payer: Self-pay | Admitting: Obstetrics and Gynecology

## 2020-03-20 DIAGNOSIS — Z1231 Encounter for screening mammogram for malignant neoplasm of breast: Secondary | ICD-10-CM

## 2020-06-11 DIAGNOSIS — M67911 Unspecified disorder of synovium and tendon, right shoulder: Secondary | ICD-10-CM | POA: Diagnosis not present

## 2020-06-11 DIAGNOSIS — Z1231 Encounter for screening mammogram for malignant neoplasm of breast: Secondary | ICD-10-CM | POA: Diagnosis not present

## 2020-06-15 DIAGNOSIS — M7661 Achilles tendinitis, right leg: Secondary | ICD-10-CM | POA: Diagnosis not present

## 2020-06-18 DIAGNOSIS — R202 Paresthesia of skin: Secondary | ICD-10-CM | POA: Diagnosis not present

## 2020-06-18 DIAGNOSIS — R531 Weakness: Secondary | ICD-10-CM | POA: Diagnosis not present

## 2020-06-18 DIAGNOSIS — M25611 Stiffness of right shoulder, not elsewhere classified: Secondary | ICD-10-CM | POA: Diagnosis not present

## 2020-06-21 DIAGNOSIS — Z0000 Encounter for general adult medical examination without abnormal findings: Secondary | ICD-10-CM | POA: Diagnosis not present

## 2020-06-21 DIAGNOSIS — F17210 Nicotine dependence, cigarettes, uncomplicated: Secondary | ICD-10-CM | POA: Diagnosis not present

## 2020-06-21 DIAGNOSIS — J309 Allergic rhinitis, unspecified: Secondary | ICD-10-CM | POA: Diagnosis not present

## 2020-06-21 DIAGNOSIS — Z1389 Encounter for screening for other disorder: Secondary | ICD-10-CM | POA: Diagnosis not present

## 2020-06-21 DIAGNOSIS — J4520 Mild intermittent asthma, uncomplicated: Secondary | ICD-10-CM | POA: Diagnosis not present

## 2020-06-21 DIAGNOSIS — D509 Iron deficiency anemia, unspecified: Secondary | ICD-10-CM | POA: Diagnosis not present

## 2020-06-21 DIAGNOSIS — Z9884 Bariatric surgery status: Secondary | ICD-10-CM | POA: Diagnosis not present

## 2020-06-21 DIAGNOSIS — R010 Benign and innocent cardiac murmurs: Secondary | ICD-10-CM | POA: Diagnosis not present

## 2020-06-21 DIAGNOSIS — G43909 Migraine, unspecified, not intractable, without status migrainosus: Secondary | ICD-10-CM | POA: Diagnosis not present

## 2020-06-21 DIAGNOSIS — M1990 Unspecified osteoarthritis, unspecified site: Secondary | ICD-10-CM | POA: Diagnosis not present

## 2020-06-26 DIAGNOSIS — R202 Paresthesia of skin: Secondary | ICD-10-CM | POA: Diagnosis not present

## 2020-06-26 DIAGNOSIS — R531 Weakness: Secondary | ICD-10-CM | POA: Diagnosis not present

## 2020-06-26 DIAGNOSIS — M25611 Stiffness of right shoulder, not elsewhere classified: Secondary | ICD-10-CM | POA: Diagnosis not present

## 2020-06-28 ENCOUNTER — Ambulatory Visit: Payer: 59

## 2020-06-28 DIAGNOSIS — M25611 Stiffness of right shoulder, not elsewhere classified: Secondary | ICD-10-CM | POA: Diagnosis not present

## 2020-06-28 DIAGNOSIS — R531 Weakness: Secondary | ICD-10-CM | POA: Diagnosis not present

## 2020-06-28 DIAGNOSIS — R202 Paresthesia of skin: Secondary | ICD-10-CM | POA: Diagnosis not present

## 2020-07-04 DIAGNOSIS — M25611 Stiffness of right shoulder, not elsewhere classified: Secondary | ICD-10-CM | POA: Diagnosis not present

## 2020-07-04 DIAGNOSIS — R531 Weakness: Secondary | ICD-10-CM | POA: Diagnosis not present

## 2020-07-04 DIAGNOSIS — R202 Paresthesia of skin: Secondary | ICD-10-CM | POA: Diagnosis not present

## 2020-07-05 DIAGNOSIS — M25611 Stiffness of right shoulder, not elsewhere classified: Secondary | ICD-10-CM | POA: Diagnosis not present

## 2020-07-05 DIAGNOSIS — R531 Weakness: Secondary | ICD-10-CM | POA: Diagnosis not present

## 2020-07-05 DIAGNOSIS — R202 Paresthesia of skin: Secondary | ICD-10-CM | POA: Diagnosis not present

## 2020-07-10 DIAGNOSIS — R531 Weakness: Secondary | ICD-10-CM | POA: Diagnosis not present

## 2020-07-10 DIAGNOSIS — M25611 Stiffness of right shoulder, not elsewhere classified: Secondary | ICD-10-CM | POA: Diagnosis not present

## 2020-07-10 DIAGNOSIS — R202 Paresthesia of skin: Secondary | ICD-10-CM | POA: Diagnosis not present

## 2020-07-12 DIAGNOSIS — M25611 Stiffness of right shoulder, not elsewhere classified: Secondary | ICD-10-CM | POA: Diagnosis not present

## 2020-07-12 DIAGNOSIS — R531 Weakness: Secondary | ICD-10-CM | POA: Diagnosis not present

## 2020-07-12 DIAGNOSIS — R202 Paresthesia of skin: Secondary | ICD-10-CM | POA: Diagnosis not present

## 2020-07-13 DIAGNOSIS — M7661 Achilles tendinitis, right leg: Secondary | ICD-10-CM | POA: Diagnosis not present

## 2020-07-13 DIAGNOSIS — M722 Plantar fascial fibromatosis: Secondary | ICD-10-CM | POA: Diagnosis not present

## 2020-07-18 DIAGNOSIS — M25611 Stiffness of right shoulder, not elsewhere classified: Secondary | ICD-10-CM | POA: Diagnosis not present

## 2020-07-19 DIAGNOSIS — M25611 Stiffness of right shoulder, not elsewhere classified: Secondary | ICD-10-CM | POA: Diagnosis not present

## 2020-07-19 DIAGNOSIS — R531 Weakness: Secondary | ICD-10-CM | POA: Diagnosis not present

## 2020-07-19 DIAGNOSIS — R202 Paresthesia of skin: Secondary | ICD-10-CM | POA: Diagnosis not present

## 2020-07-23 DIAGNOSIS — M25611 Stiffness of right shoulder, not elsewhere classified: Secondary | ICD-10-CM | POA: Diagnosis not present

## 2020-07-23 DIAGNOSIS — R202 Paresthesia of skin: Secondary | ICD-10-CM | POA: Diagnosis not present

## 2020-07-23 DIAGNOSIS — R531 Weakness: Secondary | ICD-10-CM | POA: Diagnosis not present

## 2020-07-25 DIAGNOSIS — M7662 Achilles tendinitis, left leg: Secondary | ICD-10-CM | POA: Diagnosis not present

## 2020-07-25 DIAGNOSIS — M722 Plantar fascial fibromatosis: Secondary | ICD-10-CM | POA: Diagnosis not present

## 2020-08-24 DIAGNOSIS — M722 Plantar fascial fibromatosis: Secondary | ICD-10-CM | POA: Diagnosis not present

## 2020-08-24 DIAGNOSIS — M7661 Achilles tendinitis, right leg: Secondary | ICD-10-CM | POA: Diagnosis not present

## 2020-08-24 DIAGNOSIS — M25571 Pain in right ankle and joints of right foot: Secondary | ICD-10-CM | POA: Diagnosis not present

## 2020-09-10 DIAGNOSIS — R7303 Prediabetes: Secondary | ICD-10-CM | POA: Diagnosis not present

## 2020-09-10 DIAGNOSIS — R42 Dizziness and giddiness: Secondary | ICD-10-CM | POA: Diagnosis not present

## 2020-09-10 DIAGNOSIS — G43909 Migraine, unspecified, not intractable, without status migrainosus: Secondary | ICD-10-CM | POA: Diagnosis not present

## 2020-12-28 DIAGNOSIS — H524 Presbyopia: Secondary | ICD-10-CM | POA: Diagnosis not present

## 2020-12-28 DIAGNOSIS — H47323 Drusen of optic disc, bilateral: Secondary | ICD-10-CM | POA: Diagnosis not present

## 2020-12-28 DIAGNOSIS — H4089 Other specified glaucoma: Secondary | ICD-10-CM | POA: Diagnosis not present

## 2020-12-28 DIAGNOSIS — Z83511 Family history of glaucoma: Secondary | ICD-10-CM | POA: Diagnosis not present

## 2020-12-28 DIAGNOSIS — H04123 Dry eye syndrome of bilateral lacrimal glands: Secondary | ICD-10-CM | POA: Diagnosis not present

## 2020-12-28 DIAGNOSIS — H52223 Regular astigmatism, bilateral: Secondary | ICD-10-CM | POA: Diagnosis not present

## 2020-12-28 DIAGNOSIS — H5203 Hypermetropia, bilateral: Secondary | ICD-10-CM | POA: Diagnosis not present

## 2020-12-28 DIAGNOSIS — H40243 Residual stage of angle-closure glaucoma, bilateral: Secondary | ICD-10-CM | POA: Diagnosis not present

## 2021-01-14 DIAGNOSIS — K621 Rectal polyp: Secondary | ICD-10-CM | POA: Diagnosis not present

## 2021-01-14 DIAGNOSIS — Z1211 Encounter for screening for malignant neoplasm of colon: Secondary | ICD-10-CM | POA: Diagnosis not present

## 2021-01-14 DIAGNOSIS — K5730 Diverticulosis of large intestine without perforation or abscess without bleeding: Secondary | ICD-10-CM | POA: Diagnosis not present

## 2021-01-16 DIAGNOSIS — K621 Rectal polyp: Secondary | ICD-10-CM | POA: Diagnosis not present

## 2021-02-08 DIAGNOSIS — R0981 Nasal congestion: Secondary | ICD-10-CM | POA: Diagnosis not present

## 2021-02-08 DIAGNOSIS — R059 Cough, unspecified: Secondary | ICD-10-CM | POA: Diagnosis not present

## 2021-03-19 DIAGNOSIS — Z9189 Other specified personal risk factors, not elsewhere classified: Secondary | ICD-10-CM | POA: Diagnosis not present

## 2021-03-29 DIAGNOSIS — R7303 Prediabetes: Secondary | ICD-10-CM | POA: Diagnosis not present

## 2021-06-28 DIAGNOSIS — H52203 Unspecified astigmatism, bilateral: Secondary | ICD-10-CM | POA: Diagnosis not present

## 2021-06-28 DIAGNOSIS — H47323 Drusen of optic disc, bilateral: Secondary | ICD-10-CM | POA: Diagnosis not present

## 2021-06-28 DIAGNOSIS — H40243 Residual stage of angle-closure glaucoma, bilateral: Secondary | ICD-10-CM | POA: Diagnosis not present

## 2021-06-28 DIAGNOSIS — H5203 Hypermetropia, bilateral: Secondary | ICD-10-CM | POA: Diagnosis not present

## 2021-06-28 DIAGNOSIS — H524 Presbyopia: Secondary | ICD-10-CM | POA: Diagnosis not present

## 2021-06-28 DIAGNOSIS — H4089 Other specified glaucoma: Secondary | ICD-10-CM | POA: Diagnosis not present

## 2021-06-28 DIAGNOSIS — Z83511 Family history of glaucoma: Secondary | ICD-10-CM | POA: Diagnosis not present

## 2021-06-28 DIAGNOSIS — H04123 Dry eye syndrome of bilateral lacrimal glands: Secondary | ICD-10-CM | POA: Diagnosis not present

## 2021-07-16 DIAGNOSIS — F17210 Nicotine dependence, cigarettes, uncomplicated: Secondary | ICD-10-CM | POA: Diagnosis not present

## 2021-07-16 DIAGNOSIS — Z9884 Bariatric surgery status: Secondary | ICD-10-CM | POA: Diagnosis not present

## 2021-07-16 DIAGNOSIS — J309 Allergic rhinitis, unspecified: Secondary | ICD-10-CM | POA: Diagnosis not present

## 2021-07-16 DIAGNOSIS — G43909 Migraine, unspecified, not intractable, without status migrainosus: Secondary | ICD-10-CM | POA: Diagnosis not present

## 2021-07-16 DIAGNOSIS — Z0000 Encounter for general adult medical examination without abnormal findings: Secondary | ICD-10-CM | POA: Diagnosis not present

## 2021-07-16 DIAGNOSIS — J4520 Mild intermittent asthma, uncomplicated: Secondary | ICD-10-CM | POA: Diagnosis not present

## 2021-07-16 DIAGNOSIS — D509 Iron deficiency anemia, unspecified: Secondary | ICD-10-CM | POA: Diagnosis not present

## 2021-07-16 DIAGNOSIS — R7303 Prediabetes: Secondary | ICD-10-CM | POA: Diagnosis not present

## 2021-07-17 DIAGNOSIS — M7731 Calcaneal spur, right foot: Secondary | ICD-10-CM | POA: Diagnosis not present

## 2021-07-17 DIAGNOSIS — M7661 Achilles tendinitis, right leg: Secondary | ICD-10-CM | POA: Diagnosis not present

## 2021-07-24 DIAGNOSIS — M7661 Achilles tendinitis, right leg: Secondary | ICD-10-CM | POA: Diagnosis not present

## 2021-07-24 DIAGNOSIS — M7662 Achilles tendinitis, left leg: Secondary | ICD-10-CM | POA: Diagnosis not present

## 2021-07-27 DIAGNOSIS — M25571 Pain in right ankle and joints of right foot: Secondary | ICD-10-CM | POA: Diagnosis not present

## 2021-07-31 DIAGNOSIS — M7661 Achilles tendinitis, right leg: Secondary | ICD-10-CM | POA: Diagnosis not present

## 2021-08-01 DIAGNOSIS — Z1231 Encounter for screening mammogram for malignant neoplasm of breast: Secondary | ICD-10-CM | POA: Diagnosis not present

## 2021-08-08 DIAGNOSIS — R531 Weakness: Secondary | ICD-10-CM | POA: Diagnosis not present

## 2021-08-11 HISTORY — PX: OTHER SURGICAL HISTORY: SHX169

## 2021-08-15 DIAGNOSIS — M7731 Calcaneal spur, right foot: Secondary | ICD-10-CM | POA: Diagnosis not present

## 2021-08-15 DIAGNOSIS — M7661 Achilles tendinitis, right leg: Secondary | ICD-10-CM | POA: Diagnosis not present

## 2021-08-15 DIAGNOSIS — G8918 Other acute postprocedural pain: Secondary | ICD-10-CM | POA: Diagnosis not present

## 2021-08-16 DIAGNOSIS — R531 Weakness: Secondary | ICD-10-CM | POA: Diagnosis not present

## 2021-09-07 DIAGNOSIS — R531 Weakness: Secondary | ICD-10-CM | POA: Diagnosis not present

## 2021-09-11 DIAGNOSIS — M7661 Achilles tendinitis, right leg: Secondary | ICD-10-CM | POA: Diagnosis not present

## 2021-09-11 DIAGNOSIS — M25562 Pain in left knee: Secondary | ICD-10-CM | POA: Diagnosis not present

## 2021-10-08 DIAGNOSIS — R531 Weakness: Secondary | ICD-10-CM | POA: Diagnosis not present

## 2021-11-08 DIAGNOSIS — R531 Weakness: Secondary | ICD-10-CM | POA: Diagnosis not present

## 2021-11-13 DIAGNOSIS — M7661 Achilles tendinitis, right leg: Secondary | ICD-10-CM | POA: Diagnosis not present

## 2021-12-08 DIAGNOSIS — R531 Weakness: Secondary | ICD-10-CM | POA: Diagnosis not present

## 2021-12-23 DIAGNOSIS — J40 Bronchitis, not specified as acute or chronic: Secondary | ICD-10-CM | POA: Diagnosis not present

## 2021-12-23 DIAGNOSIS — J4520 Mild intermittent asthma, uncomplicated: Secondary | ICD-10-CM | POA: Diagnosis not present

## 2021-12-25 IMAGING — MG DIGITAL SCREENING BILAT W/ CAD
6 series · 6 of 6 positions shown · non-contrast
Comparison: Previous exam(s).

CLINICAL DATA: Screening.

EXAM:
DIGITAL SCREENING BILATERAL MAMMOGRAM WITH CAD

[R MLO (1 of 2)]
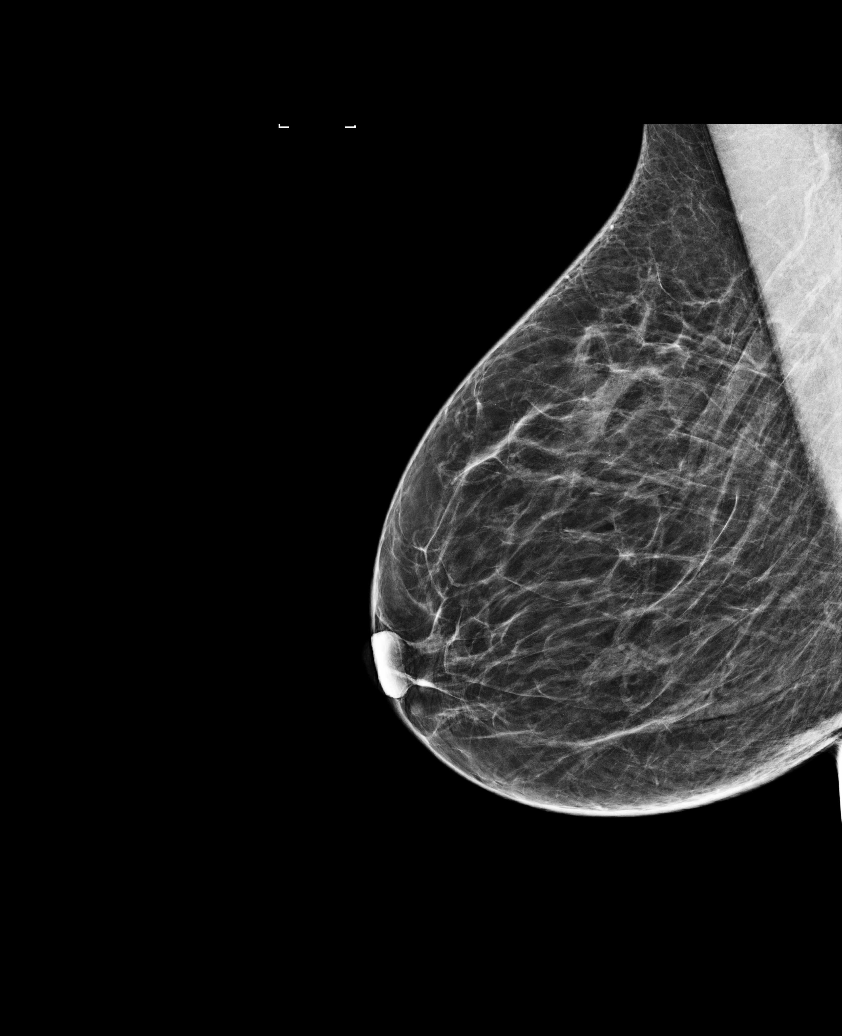

[L MLO (1 of 2)]
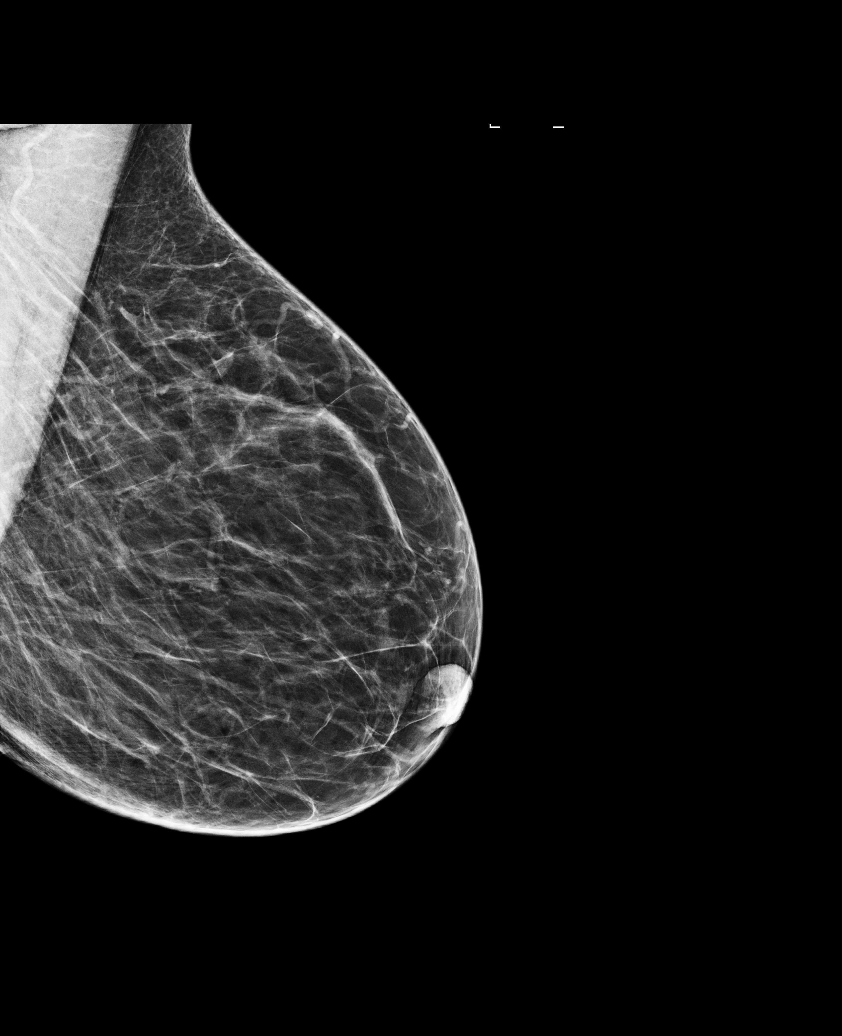

[R CC]
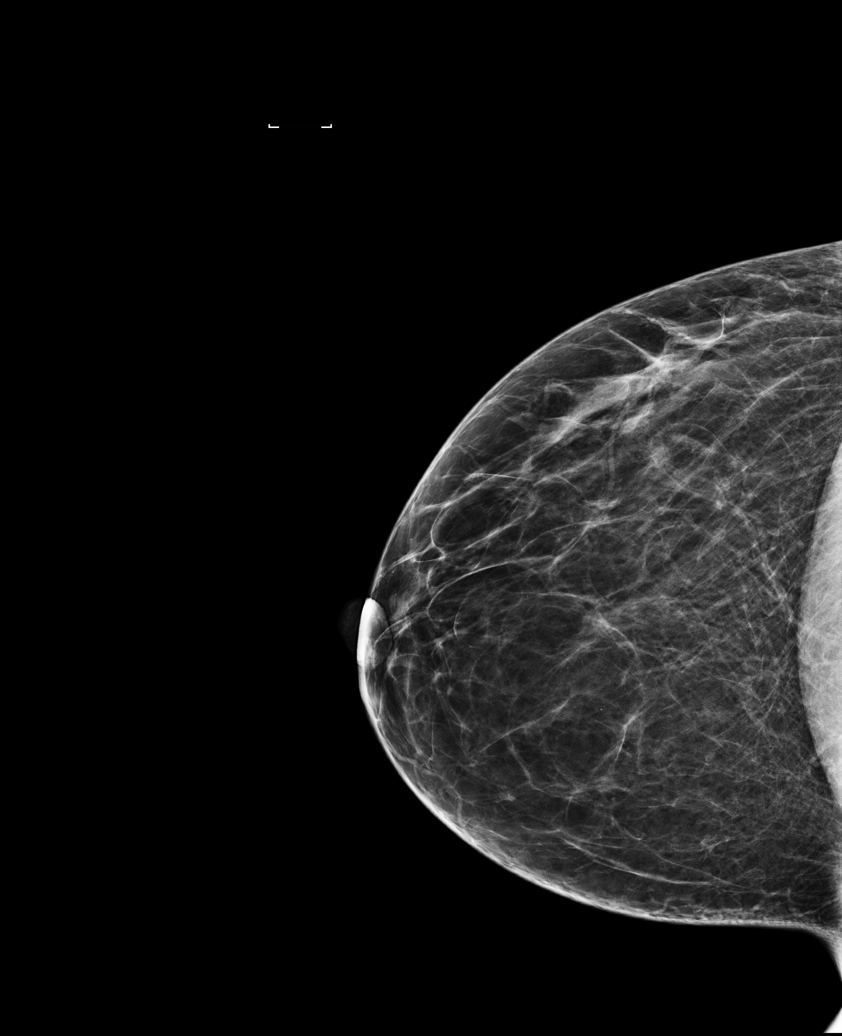

[L MLO (2 of 2)]
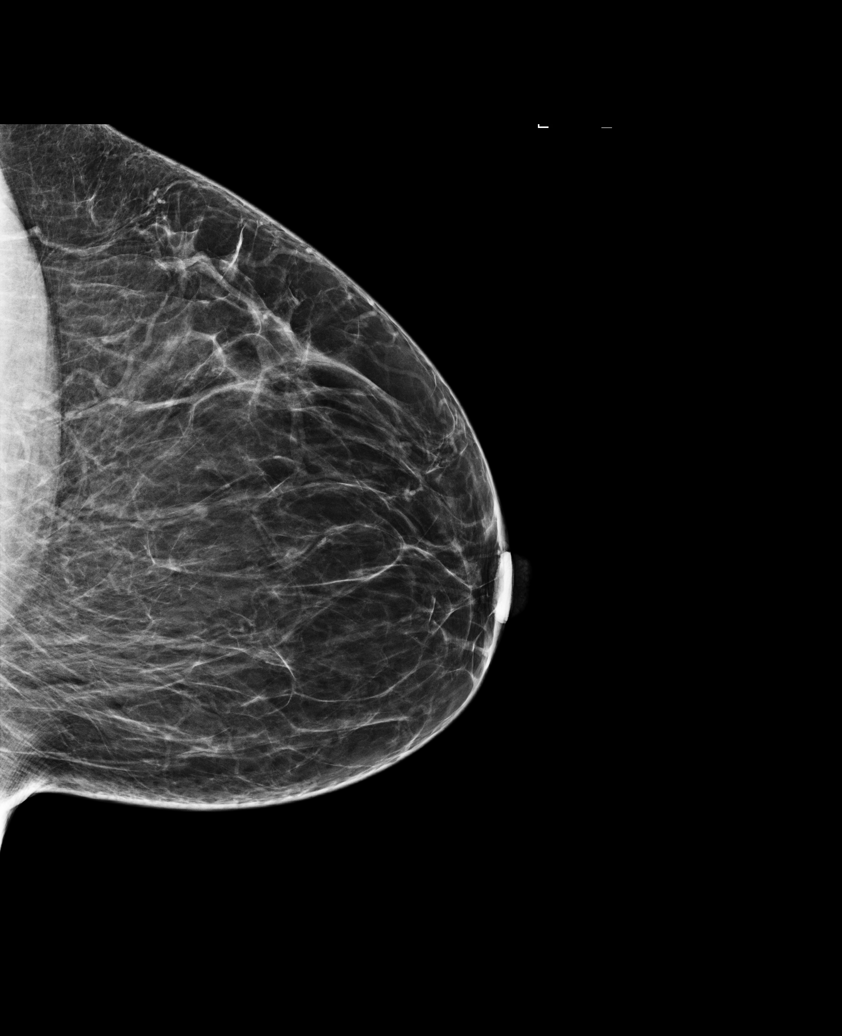

[R MLO (2 of 2)]
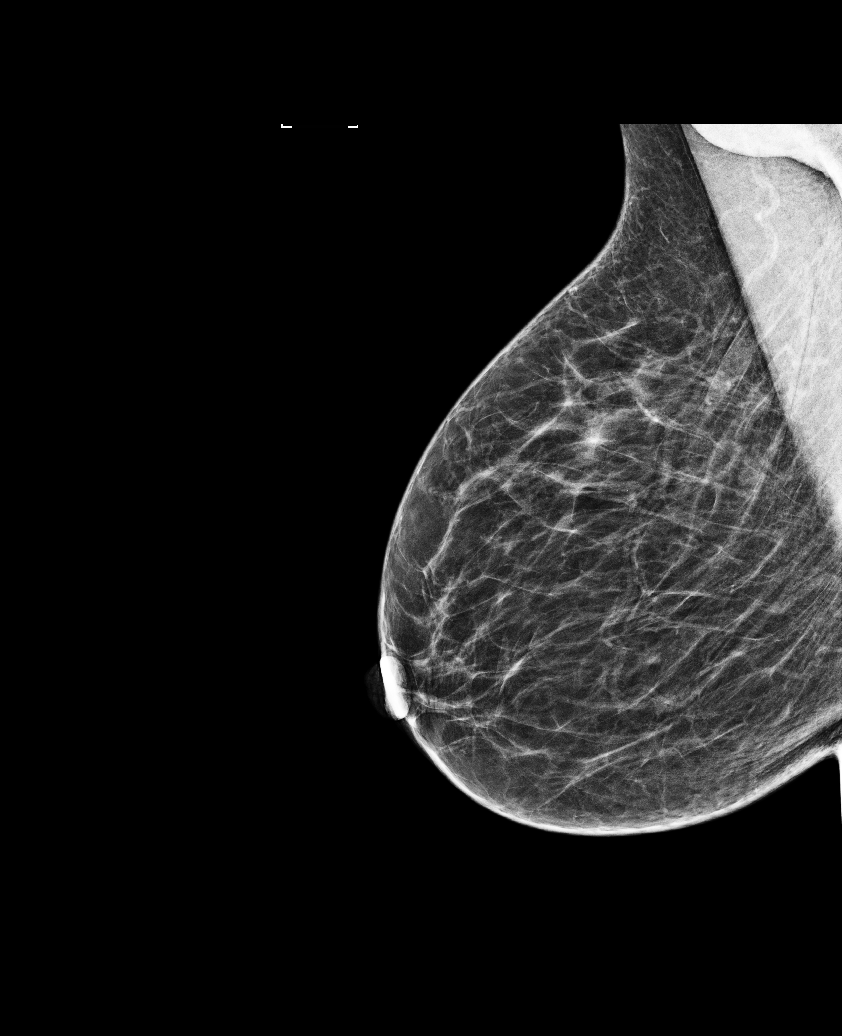

[L CC]
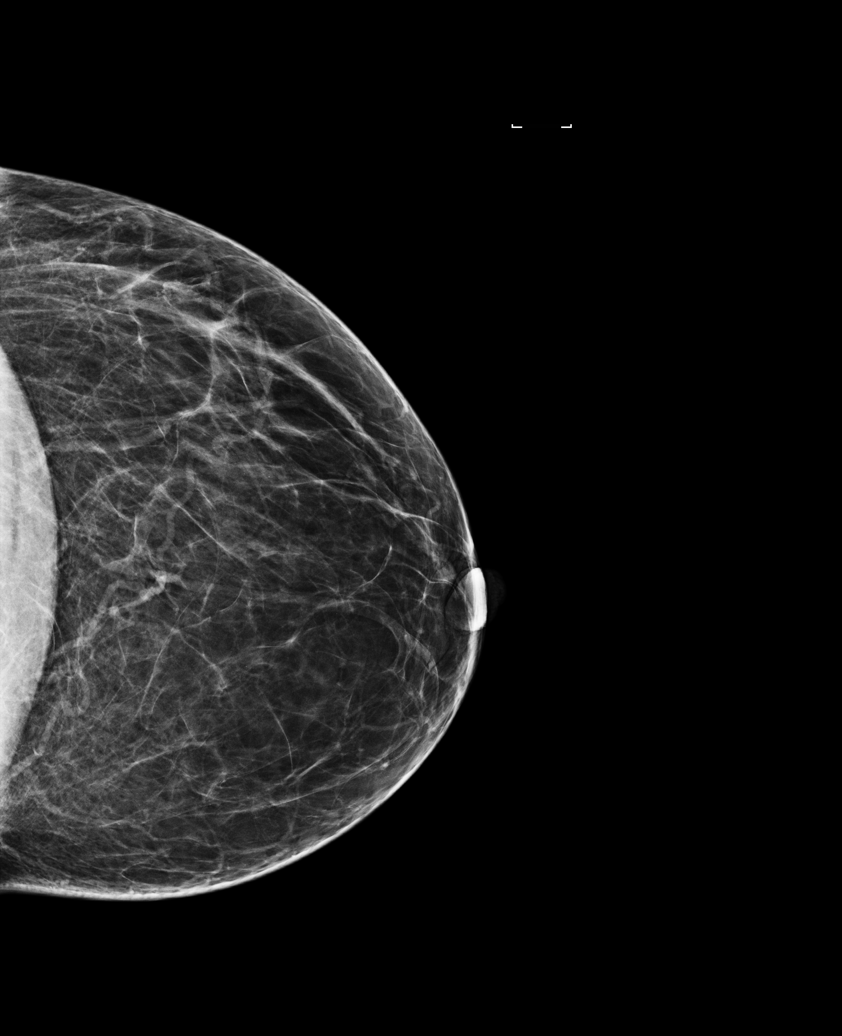

[6 of 6 positions shown; findings below may reference images not displayed]

ACR Breast Density Category b: There are scattered areas of
fibroglandular density.
FINDINGS: There are no findings suspicious for malignancy. Images were
processed with CAD.
IMPRESSION: No mammographic evidence of malignancy. A result letter of this
screening mammogram will be mailed directly to the patient.

RECOMMENDATION:
Screening mammogram in one year. (Code:AS-G-LCT)

BI-RADS CATEGORY  1: Negative.

## 2022-01-01 DIAGNOSIS — H5203 Hypermetropia, bilateral: Secondary | ICD-10-CM | POA: Diagnosis not present

## 2022-01-01 DIAGNOSIS — H40243 Residual stage of angle-closure glaucoma, bilateral: Secondary | ICD-10-CM | POA: Diagnosis not present

## 2022-01-01 DIAGNOSIS — H524 Presbyopia: Secondary | ICD-10-CM | POA: Diagnosis not present

## 2022-01-01 DIAGNOSIS — H4089 Other specified glaucoma: Secondary | ICD-10-CM | POA: Diagnosis not present

## 2022-01-01 DIAGNOSIS — H52203 Unspecified astigmatism, bilateral: Secondary | ICD-10-CM | POA: Diagnosis not present

## 2022-01-01 DIAGNOSIS — H2513 Age-related nuclear cataract, bilateral: Secondary | ICD-10-CM | POA: Diagnosis not present

## 2022-01-08 DIAGNOSIS — R531 Weakness: Secondary | ICD-10-CM | POA: Diagnosis not present

## 2022-01-10 DIAGNOSIS — M7661 Achilles tendinitis, right leg: Secondary | ICD-10-CM | POA: Diagnosis not present

## 2022-02-07 DIAGNOSIS — R531 Weakness: Secondary | ICD-10-CM | POA: Diagnosis not present

## 2022-03-10 DIAGNOSIS — R531 Weakness: Secondary | ICD-10-CM | POA: Diagnosis not present

## 2022-03-25 DIAGNOSIS — Z9189 Other specified personal risk factors, not elsewhere classified: Secondary | ICD-10-CM | POA: Diagnosis not present

## 2022-04-09 DIAGNOSIS — H04123 Dry eye syndrome of bilateral lacrimal glands: Secondary | ICD-10-CM | POA: Diagnosis not present

## 2022-04-09 DIAGNOSIS — H2513 Age-related nuclear cataract, bilateral: Secondary | ICD-10-CM | POA: Diagnosis not present

## 2022-04-09 DIAGNOSIS — H47323 Drusen of optic disc, bilateral: Secondary | ICD-10-CM | POA: Diagnosis not present

## 2022-04-09 DIAGNOSIS — R519 Headache, unspecified: Secondary | ICD-10-CM | POA: Diagnosis not present

## 2022-04-09 DIAGNOSIS — H52203 Unspecified astigmatism, bilateral: Secondary | ICD-10-CM | POA: Diagnosis not present

## 2022-04-09 DIAGNOSIS — H5203 Hypermetropia, bilateral: Secondary | ICD-10-CM | POA: Diagnosis not present

## 2022-04-09 DIAGNOSIS — H524 Presbyopia: Secondary | ICD-10-CM | POA: Diagnosis not present

## 2022-04-09 DIAGNOSIS — Q112 Microphthalmos: Secondary | ICD-10-CM | POA: Diagnosis not present

## 2022-04-09 DIAGNOSIS — H402232 Chronic angle-closure glaucoma, bilateral, moderate stage: Secondary | ICD-10-CM | POA: Diagnosis not present

## 2022-04-09 DIAGNOSIS — Z83511 Family history of glaucoma: Secondary | ICD-10-CM | POA: Diagnosis not present

## 2022-04-10 DIAGNOSIS — R531 Weakness: Secondary | ICD-10-CM | POA: Diagnosis not present

## 2022-05-09 DIAGNOSIS — R531 Weakness: Secondary | ICD-10-CM | POA: Diagnosis not present

## 2022-06-30 ENCOUNTER — Encounter (HOSPITAL_BASED_OUTPATIENT_CLINIC_OR_DEPARTMENT_OTHER): Payer: Self-pay | Admitting: Urology

## 2022-06-30 ENCOUNTER — Emergency Department (HOSPITAL_BASED_OUTPATIENT_CLINIC_OR_DEPARTMENT_OTHER): Payer: 59

## 2022-06-30 ENCOUNTER — Inpatient Hospital Stay (HOSPITAL_BASED_OUTPATIENT_CLINIC_OR_DEPARTMENT_OTHER)
Admission: EM | Admit: 2022-06-30 | Discharge: 2022-07-04 | DRG: 388 | Disposition: A | Discharge status: Home / Self Care | Payer: 59 | Attending: General Surgery | Admitting: General Surgery

## 2022-06-30 DIAGNOSIS — R6881 Early satiety: Secondary | ICD-10-CM | POA: Diagnosis not present

## 2022-06-30 DIAGNOSIS — F259 Schizoaffective disorder, unspecified: Secondary | ICD-10-CM | POA: Diagnosis not present

## 2022-06-30 DIAGNOSIS — F17210 Nicotine dependence, cigarettes, uncomplicated: Secondary | ICD-10-CM | POA: Diagnosis present

## 2022-06-30 DIAGNOSIS — Z6830 Body mass index (BMI) 30.0-30.9, adult: Secondary | ICD-10-CM | POA: Diagnosis not present

## 2022-06-30 DIAGNOSIS — R059 Cough, unspecified: Secondary | ICD-10-CM | POA: Diagnosis not present

## 2022-06-30 DIAGNOSIS — R1013 Epigastric pain: Secondary | ICD-10-CM | POA: Diagnosis not present

## 2022-06-30 DIAGNOSIS — Z1152 Encounter for screening for COVID-19: Secondary | ICD-10-CM | POA: Diagnosis not present

## 2022-06-30 DIAGNOSIS — J189 Pneumonia, unspecified organism: Secondary | ICD-10-CM | POA: Diagnosis not present

## 2022-06-30 DIAGNOSIS — Z91030 Bee allergy status: Secondary | ICD-10-CM | POA: Diagnosis not present

## 2022-06-30 DIAGNOSIS — Z7951 Long term (current) use of inhaled steroids: Secondary | ICD-10-CM

## 2022-06-30 DIAGNOSIS — Z980 Intestinal bypass and anastomosis status: Secondary | ICD-10-CM | POA: Diagnosis not present

## 2022-06-30 DIAGNOSIS — K56600 Partial intestinal obstruction, unspecified as to cause: Secondary | ICD-10-CM | POA: Diagnosis not present

## 2022-06-30 DIAGNOSIS — K56609 Unspecified intestinal obstruction, unspecified as to partial versus complete obstruction: Secondary | ICD-10-CM | POA: Diagnosis not present

## 2022-06-30 DIAGNOSIS — H409 Unspecified glaucoma: Secondary | ICD-10-CM | POA: Diagnosis present

## 2022-06-30 DIAGNOSIS — E669 Obesity, unspecified: Secondary | ICD-10-CM | POA: Diagnosis not present

## 2022-06-30 DIAGNOSIS — Z9884 Bariatric surgery status: Secondary | ICD-10-CM

## 2022-06-30 DIAGNOSIS — K6389 Other specified diseases of intestine: Secondary | ICD-10-CM | POA: Diagnosis not present

## 2022-06-30 DIAGNOSIS — R109 Unspecified abdominal pain: Secondary | ICD-10-CM | POA: Diagnosis not present

## 2022-06-30 DIAGNOSIS — Z91013 Allergy to seafood: Secondary | ICD-10-CM | POA: Diagnosis not present

## 2022-06-30 DIAGNOSIS — Z79899 Other long term (current) drug therapy: Secondary | ICD-10-CM | POA: Diagnosis not present

## 2022-06-30 LAB — URINALYSIS, ROUTINE W REFLEX MICROSCOPIC
Bilirubin Urine: NEGATIVE
Glucose, UA: NEGATIVE mg/dL
Hgb urine dipstick: NEGATIVE
Ketones, ur: NEGATIVE mg/dL
Leukocytes,Ua: NEGATIVE
Nitrite: NEGATIVE
Protein, ur: NEGATIVE mg/dL
Specific Gravity, Urine: 1.01 (ref 1.005–1.030)
pH: 5 (ref 5.0–8.0)

## 2022-06-30 LAB — CBC WITH DIFFERENTIAL/PLATELET
Abs Immature Granulocytes: 0.01 10*3/uL (ref 0.00–0.07)
Basophils Absolute: 0 10*3/uL (ref 0.0–0.1)
Basophils Relative: 0 %
Eosinophils Absolute: 0.1 10*3/uL (ref 0.0–0.5)
Eosinophils Relative: 1 %
HCT: 36.6 % (ref 36.0–46.0)
Hemoglobin: 11.8 g/dL — ABNORMAL LOW (ref 12.0–15.0)
Immature Granulocytes: 0 %
Lymphocytes Relative: 25 %
Lymphs Abs: 1.6 10*3/uL (ref 0.7–4.0)
MCH: 29.3 pg (ref 26.0–34.0)
MCHC: 32.2 g/dL (ref 30.0–36.0)
MCV: 90.8 fL (ref 80.0–100.0)
Monocytes Absolute: 0.6 10*3/uL (ref 0.1–1.0)
Monocytes Relative: 9 %
Neutro Abs: 4.2 10*3/uL (ref 1.7–7.7)
Neutrophils Relative %: 65 %
Platelets: 282 10*3/uL (ref 150–400)
RBC: 4.03 MIL/uL (ref 3.87–5.11)
RDW: 13.6 % (ref 11.5–15.5)
WBC: 6.5 10*3/uL (ref 4.0–10.5)
nRBC: 0 % (ref 0.0–0.2)

## 2022-06-30 LAB — RESP PANEL BY RT-PCR (RSV, FLU A&B, COVID)  RVPGX2
Influenza A by PCR: NEGATIVE
Influenza B by PCR: NEGATIVE
Resp Syncytial Virus by PCR: NEGATIVE
SARS Coronavirus 2 by RT PCR: NEGATIVE

## 2022-06-30 LAB — COMPREHENSIVE METABOLIC PANEL WITH GFR
ALT: 18 U/L (ref 0–44)
AST: 24 U/L (ref 15–41)
Albumin: 3.9 g/dL (ref 3.5–5.0)
Alkaline Phosphatase: 105 U/L (ref 38–126)
Anion gap: 7 (ref 5–15)
BUN: 12 mg/dL (ref 6–20)
CO2: 25 mmol/L (ref 22–32)
Calcium: 9.2 mg/dL (ref 8.9–10.3)
Chloride: 106 mmol/L (ref 98–111)
Creatinine, Ser: 0.66 mg/dL (ref 0.44–1.00)
GFR, Estimated: >60 mL/min
Glucose, Bld: 96 mg/dL (ref 70–99)
Potassium: 3.8 mmol/L (ref 3.5–5.1)
Sodium: 138 mmol/L (ref 135–145)
Total Bilirubin: 0.3 mg/dL (ref 0.3–1.2)
Total Protein: 7.8 g/dL (ref 6.5–8.1)

## 2022-06-30 LAB — LIPASE, BLOOD: Lipase: 25 U/L (ref 11–51)

## 2022-06-30 MED ORDER — ONDANSETRON HCL 4 MG/2ML IJ SOLN: 4.0000 mg | Freq: Four times a day (QID) | INTRAMUSCULAR | Status: DC | PRN

## 2022-06-30 MED ORDER — OXYCODONE HCL 5 MG PO TABS
5.0000 mg | ORAL_TABLET | ORAL | Status: DC | PRN
  Administered 2022-07-01: 5 mg via ORAL
  Filled 2022-06-30: qty 1

## 2022-06-30 MED ORDER — IOHEXOL 300 MG/ML  SOLN
100.0000 mL | Freq: Once | INTRAMUSCULAR | Status: AC | PRN
  Administered 2022-06-30: 100 mL via INTRAVENOUS

## 2022-06-30 MED ORDER — IPRATROPIUM-ALBUTEROL 0.5-2.5 (3) MG/3ML IN SOLN
3.0000 mL | Freq: Once | RESPIRATORY_TRACT | Status: AC
  Administered 2022-06-30: 3 mL via RESPIRATORY_TRACT
  Filled 2022-06-30: qty 3

## 2022-06-30 MED ORDER — SUCRALFATE 1 GM/10ML PO SUSP
1.0000 g | Freq: Four times a day (QID) | ORAL | Status: DC
  Administered 2022-06-30 – 2022-07-03 (×10): 1 g via ORAL
  Filled 2022-06-30 (×10): qty 10

## 2022-06-30 MED ORDER — MORPHINE SULFATE (PF) 4 MG/ML IV SOLN
4.0000 mg | Freq: Once | INTRAVENOUS | Status: AC
  Administered 2022-06-30: 4 mg via INTRAVENOUS
  Filled 2022-06-30: qty 1

## 2022-06-30 MED ORDER — ONDANSETRON 4 MG PO TBDP: 4.0000 mg | ORAL_TABLET | Freq: Four times a day (QID) | ORAL | Status: DC | PRN

## 2022-06-30 MED ORDER — MORPHINE SULFATE (PF) 2 MG/ML IV SOLN: 2.0000 mg | INTRAVENOUS | Status: DC | PRN

## 2022-06-30 MED ORDER — PANTOPRAZOLE SODIUM 40 MG IV SOLR
40.0000 mg | Freq: Every day | INTRAVENOUS | Status: DC
  Administered 2022-06-30 – 2022-07-03 (×4): 40 mg via INTRAVENOUS
  Filled 2022-06-30 (×4): qty 10

## 2022-06-30 MED ORDER — IPRATROPIUM-ALBUTEROL 0.5-2.5 (3) MG/3ML IN SOLN
3.0000 mL | Freq: Four times a day (QID) | RESPIRATORY_TRACT | Status: DC | PRN
  Administered 2022-06-30 – 2022-07-01 (×3): 3 mL via RESPIRATORY_TRACT
  Filled 2022-06-30 (×3): qty 3

## 2022-06-30 MED ORDER — ALBUTEROL SULFATE (2.5 MG/3ML) 0.083% IN NEBU
2.5000 mg | INHALATION_SOLUTION | Freq: Once | RESPIRATORY_TRACT | Status: AC
  Administered 2022-06-30: 2.5 mg via RESPIRATORY_TRACT
  Filled 2022-06-30: qty 3

## 2022-06-30 MED ORDER — DIPHENHYDRAMINE HCL 25 MG PO CAPS: 25.0000 mg | ORAL_CAPSULE | Freq: Four times a day (QID) | ORAL | Status: DC | PRN

## 2022-06-30 MED ORDER — ENOXAPARIN SODIUM 40 MG/0.4ML IJ SOSY
40.0000 mg | PREFILLED_SYRINGE | INTRAMUSCULAR | Status: DC
  Administered 2022-06-30: 40 mg via SUBCUTANEOUS
  Filled 2022-06-30: qty 0.4

## 2022-06-30 MED ORDER — DOCUSATE SODIUM 100 MG PO CAPS
100.0000 mg | ORAL_CAPSULE | Freq: Two times a day (BID) | ORAL | Status: DC
  Administered 2022-06-30 – 2022-07-03 (×7): 100 mg via ORAL
  Filled 2022-06-30 (×8): qty 1

## 2022-06-30 MED ORDER — DIPHENHYDRAMINE HCL 50 MG/ML IJ SOLN: 25.0000 mg | Freq: Four times a day (QID) | INTRAMUSCULAR | Status: DC | PRN

## 2022-06-30 NOTE — ED Notes (Signed)
Patient came from [facility] med center with some abdominal obstruction. History of Asthma has and received 2 duo nap at the center.

## 2022-06-30 NOTE — ED Notes (Addendum)
Melinda Elliott, RT at bedside Spo2 100% but pt is labored with exertion and speaking  Having productive cough and reports h/0 asthma  Used inhaler this am

## 2022-06-30 NOTE — ED Notes (Signed)
[facility] at bedside 

## 2022-06-30 NOTE — ED Provider Notes (Signed)
[place] [facility] AT [facility] Provider Note   CSN: [id number] Arrival date & time: 06/30/22  1402     History  Chief Complaint  Patient presents with   Multiple Complaints    Melinda Elliott is a 52 y.o. adult.  With history of migraines and schizoaffective disorder who presents to the ED for evaluation of shortness of breath and abdominal pain.  She states she noticed a mass in the epigastric region of her abdomen 2 weeks ago.  She has associated early satiety and pain with swallowing.  She is status post gastric bypass.  Pain does not radiate.  She mostly notices the pain when she eats or drinks.  Not present at rest.  She states she developed some congestion, rhinorrhea, shortness of breath and cough yesterday as well.  She has been exposed to her grandchildren who have been sick recently.  Her cough is productive of yellow to green sputum.  She is also wheezing which is worse at night.  She has been using her inhaler with minimal improvement.  Believes her asthma is getting worse.  Denies fevers, chills, chest pain, nausea, vomiting, diarrhea, constipation, urinary symptoms.  She had a bowel movement today.  Has also been passing gas.  Reports her gastric bypass was in 2005.  Afterwards she had a complication with a "rupture" which needed surgical intervention.  HPI     Home Medications Prior to Admission medications   Medication Sig Start Date End Date Taking? Authorizing Provider  atomoxetine (STRATTERA) 25 MG capsule Take 25 mg by mouth daily.    [provider]  b complex vitamins tablet Take 1 tablet by mouth daily.    [provider]  Brinzolamide-Brimonidine [facility]) 1-0.2 % SUSP Apply to eye in the morning, at noon, and at bedtime.    [provider]  calcium gluconate 500 MG tablet Take 1 tablet by mouth 3 (three) times daily.    [provider]  clorazepate (TRANXENE) 3.75 MG tablet Take 3.75 mg by mouth 2  (two) times daily as needed for anxiety.    [provider]  ELDERBERRY PO Take by mouth.    [provider]  gabapentin (NEURONTIN) 300 MG capsule Take 300 mg by mouth 3 (three) times daily.    [provider]  Iron Combinations (IRON COMPLEX) CAPS Take by mouth.    [provider]  rizatriptan (MAXALT) 10 MG tablet Take 10 mg by mouth as needed for migraine. May repeat in 2 hours if needed    [provider]  vitamin C (ASCORBIC ACID) 250 MG tablet Take 250 mg by mouth daily.    [provider]  Zinc 40 MG TABS Take by mouth.    [provider]  ZIPRASIDONE HCL PO Take 400 mg by mouth at bedtime.    [provider]      Allergies    Bee venom and Shrimp [shellfish allergy]    Review of Systems   Review of Systems  Respiratory:  Positive for cough and shortness of breath.   Gastrointestinal:  Positive for abdominal pain.  All other systems reviewed and are negative.   Physical Exam Updated Vital Signs BP 133/72   Pulse 83   Temp 98.2 F (36.8 C) (Oral)   Resp (!) 22   Ht 5\' 6"  (1.676 m)   Wt 85.1 kg   LMP 07/29/2019   SpO2 100%   BMI 30.28 kg/m  Physical Exam Vitals and  nursing note reviewed.  Constitutional:      General: He is not in acute distress.    Appearance: He is well-developed. He is not ill-appearing, toxic-appearing or diaphoretic.  HENT:     Head: Normocephalic and atraumatic.     Nose: Congestion and rhinorrhea present.  Eyes:     Conjunctiva/sclera: Conjunctivae normal.  Cardiovascular:     Rate and Rhythm: Normal rate and regular rhythm.     Heart sounds: No murmur heard. Pulmonary:     Effort: Pulmonary effort is normal. No respiratory distress.     Breath sounds: Wheezing and rhonchi present.  Abdominal:     Palpations: Abdomen is soft.     Tenderness: There is no abdominal tenderness.     Comments: Pain to the upper abdominal area with a ventral mass palpated.  No rashes   Musculoskeletal:        General: No swelling.     Cervical back: Neck supple.     Right lower leg: No edema.     Left lower leg: No edema.  Skin:    General: Skin is warm and dry.     Capillary Refill: Capillary refill takes less than 2 seconds.  Neurological:     General: No focal deficit present.     Mental Status: He is alert and oriented to person, place, and time.  Psychiatric:        Mood and Affect: Mood normal.     ED Results / Procedures / Treatments   Labs (all labs ordered are listed, but only abnormal results are displayed) Labs Reviewed  CBC WITH DIFFERENTIAL/PLATELET - Abnormal; Notable for the following components:      Result Value   Hemoglobin 11.8 (*)    All other components within normal limits  RESP PANEL BY RT-PCR (RSV, FLU A&B, COVID)  RVPGX2  COMPREHENSIVE METABOLIC PANEL  LIPASE, BLOOD  URINALYSIS, ROUTINE W REFLEX MICROSCOPIC    EKG None  Radiology CT ABDOMEN PELVIS W CONTRAST  Result Date: 06/30/2022 CLINICAL DATA:  Abdominal pain, status post gastric bypass EXAM: CT ABDOMEN AND PELVIS WITH CONTRAST TECHNIQUE: Multidetector CT imaging of the abdomen and pelvis was performed using the standard protocol following bolus administration of intravenous contrast. RADIATION DOSE REDUCTION: This exam was performed according to the departmental dose-optimization program which includes automated exposure control, adjustment of the mA and/or kV according to patient size and/or use of iterative reconstruction technique. CONTRAST:  OMNIPAQUE IOHEXOL 300 MG/ML  SOLN COMPARISON:  None Available. FINDINGS: Lower chest: No pleural or pericardial effusion. Visualized lung bases clear. Hepatobiliary: No focal liver abnormality is seen. No gallstones, gallbladder wall thickening, or biliary dilatation. Pancreas: Unremarkable. No pancreatic ductal dilatation or surrounding inflammatory changes. Spleen: Normal in size without focal abnormality. Adrenals/Urinary Tract:  Adrenal glands are unremarkable. Kidneys are normal, without renal calculi, focal lesion, or hydronephrosis. Bladder is unremarkable. Stomach/Bowel: Post gastric bypass surgery. Gastric components appear decompressed. There is a left upper quadrant small bowel loop with anastomotic staples, dilated up to 7.5 cm diameter with gas/fluid level. Distal small bowel is nondilated. Normal appendix. Moderate colonic fecal material without dilatation or acute finding. Vascular/Lymphatic: No significant vascular findings are present. No enlarged abdominal or pelvic lymph nodes. Reproductive: Uterus and bilateral adnexa are unremarkable. Other: Bilateral pelvic phleboliths.  No ascites.  No free air. Musculoskeletal: Degenerative disc disease L5-S1. No acute findings. IMPRESSION: 1. Post gastric bypass surgery. Dilated left upper quadrant small bowel loop with anastomotic staples, suggesting partial obstruction. Electronically  Signed   By: Corlis Leak M.D.   On: 06/30/2022 16:18   DG Chest 2 View  Result Date: 06/30/2022 CLINICAL DATA:  Cough EXAM: CHEST - 2 VIEW COMPARISON:  None Available. FINDINGS: The heart size and mediastinal contours are within normal limits. No pleural effusion. No pneumothorax. There is hazy opacity in the right infrahilar region that could represent atelectasis or infection. The visualized skeletal structures are unremarkable. IMPRESSION: Hazy opacity in the right infrahilar region could represent atelectasis or infection. Electronically Signed   By: Lorenza Cambridge M.D.   On: 06/30/2022 14:49    Procedures Procedures    Medications Ordered in ED Medications  ipratropium-albuterol (DUONEB) 0.5-2.5 (3) MG/3ML nebulizer solution 3 mL (3 mLs Nebulization Given 06/30/22 1454)  albuterol (PROVENTIL) (2.5 MG/3ML) 0.083% nebulizer solution 2.5 mg (2.5 mg Nebulization Given 06/30/22 1454)  iohexol (OMNIPAQUE) 300 MG/ML solution 100 mL (100 mLs Intravenous Contrast Given 06/30/22 1601)   ipratropium-albuterol (DUONEB) 0.5-2.5 (3) MG/3ML nebulizer solution 3 mL (3 mLs Nebulization Given 06/30/22 1644)  morphine (PF) 4 MG/ML injection 4 mg (4 mg Intravenous Given 06/30/22 1714)    ED Course/ Medical Decision Making/ A&P Clinical Course as of 06/30/22 1800  Mon Jun 30, 2022  1738 Spoke with general surgery Dr. Maisie Fus.  She recommends ED to ED transfer to [facility] for urgent evaluation.  Bypass NG tube in the ED. [AS]  1748 Dr. Estell Harpin at [facility] accepts ED to ED transfer [AS]    Clinical Course User Index [AS] Lula Olszewski Edsel Petrin, PA-C                             Medical Decision Making Amount and/or Complexity of Data Reviewed Labs: ordered. Radiology: ordered.  Risk Prescription drug management.  This patient presents to the ED for concern of shortness of breath, abdominal pain, this involves an extensive number of treatment options, and is a complaint that carries with it a high risk of complications and morbidity.  The emergent differential diagnosis for shortness of breath includes, but is not limited to, Pulmonary edema, bronchoconstriction, Pneumonia, Pulmonary embolism, Pneumotherax/ Hemothorax, Dysrythmia, ACS.    The differential diagnosis for generalized abdominal pain includes, but is not limited to AAA, gastroenteritis, appendicitis, Bowel obstruction, Bowel perforation. Gastroparesis, DKA, Hernia, Inflammatory bowel disease, mesenteric ischemia, pancreatitis, peritonitis SBP, volvulus.   Co morbidities that complicate the patient evaluation   glaucoma, migraines, schizoaffective disorder  My initial workup includes labs, imaging, DuoNeb  Additional history obtained from: Nursing notes from this visit.  I ordered, reviewed and interpreted labs which include: CBC, CMP, lipase, urinalysis, respiratory panel  I ordered imaging studies including chest x-ray, CT abdomen pelvis I independently visualized and interpreted imaging which showed hazy  opacity in the right lung concerning for atelectasis versus infection.  CT abdomen pelvis shows partial small bowel obstruction. I agree with the radiologist interpretation  Cardiac Monitoring:  The patient was maintained on a cardiac monitor.  I personally viewed and interpreted the cardiac monitored which showed an underlying rhythm of: NSR  Consultations Obtained:  I requested consultation with general surgery Dr. Maisie Fus,  and discussed lab and imaging findings as well as pertinent plan - they recommend: Immediate ED to ED transfer to [facility] for consultation  Afebrile, hemodynamically stable.  52 year old female presents ED for evaluation of URI type symptoms and abdominal pain.  She did have sitting and wheezing and rhonchi on initial evaluation.  She  received DuoNeb treatments in the ED with significant improvement in her symptoms.  X-ray concerning for right middle lobe pneumonia.  Will defer treatment for this until patient is assessed at admitting hospital.  Also complaining of early satiety and postprandial abdominal pain.  She has some nausea as well.  States her bowels have slowed but she is still having bowel movements and passing gas.  Has a history of gastric bypass.  She has some abdominal TTP on exam with a mass consistent with a dilated loop of bowel.  CT abdomen pelvis reveals partial small bowel obstruction.  general surgery was consulted and recommends above.  Patient will be emergently transferred to [facility] for surgery consultation.  Dr. Estell Harpin at [facility], ED accepts transfer.  Discussed plan with patient.  She is in agreement.  Stable at the time of transfer.  Patient's case discussed with Dr. Lockie Mola who agrees with plan to transfer for consultation.   Note: Portions of this report may have been transcribed using voice recognition software. Every effort was made to ensure accuracy; however, inadvertent computerized transcription errors may still be  present.        Final Clinical Impression(s) / ED Diagnoses Final diagnoses:  Partial small bowel obstruction (HCC)  Community acquired pneumonia of right middle lobe of lung    Rx / DC Orders ED Discharge Orders     None         Michelle Piper, Cordelia Poche 06/30/22 1800    Virgina Norfolk, DO 06/30/22 2247

## 2022-06-30 NOTE — ED Notes (Signed)
Patient transported to CT 

## 2022-06-30 NOTE — ED Triage Notes (Signed)
Pt states knot to top of abdomen that is keeping her from eating right x 2 weeks, feels like things getting stuck when swallowing States congestion and cough that started on Saturday Denies any fever

## 2022-06-30 NOTE — H&P (Signed)
CC: nausea, abd pain  Requesting provider: Dr Estell Harpin  HPI: Melinda Elliott is an 52 y.o. female who presented to [facility] with SOB and abd pain for the past 2 wks.  She has early satiety and pain with swallowing as well.  She is s/p RNYGB at [facility] in 2005. It sounds like she has had 2 additional surgeries at [facility] for complications in the years afterwards.  She is actively smoking, denies NSAID use.      Past Medical History:  Diagnosis Date   Glaucoma    Migraines    Schizoaffective disorder [facility])     Past Surgical History:  Procedure Laterality Date   ABDOMINAL SURGERY     CESAREAN SECTION     GASTRIC BYPASS     HERNIA REPAIR     HYSTEROSCOPY WITH D & C N/A 08/02/2019   Procedure: DILATATION AND CURETTAGE /HYSTEROSCOPY;  Surgeon: Myna Hidalgo, DO;  Location: [facility];  Service: Gynecology;  Laterality: N/A;   LASIK      History reviewed. No pertinent family history.  Social:  reports that she has been smoking cigarettes. She has been smoking an average of .5 packs per day. She has never used smokeless tobacco. She reports current alcohol use. She reports that she does not use drugs.  Allergies:  Allergies  Allergen Reactions   Bee Venom Anaphylaxis   Shrimp [Shellfish Allergy] Anaphylaxis    Medications: I have reviewed the patient's current medications.  Results for orders placed or performed during the hospital encounter of 06/30/22 (from the past 48 hour(s))  Resp panel by RT-PCR (RSV, Flu A&B, Covid) Anterior Nasal Swab     Status: None   Collection Time: 06/30/22  2:34 PM   Specimen: Anterior Nasal Swab  Result Value Ref Range   SARS Coronavirus 2 by RT PCR NEGATIVE NEGATIVE    Comment: (NOTE) SARS-CoV-2 target nucleic acids are NOT DETECTED.  The SARS-CoV-2 RNA is generally detectable in upper respiratory specimens during the acute phase of infection. The lowest concentration of SARS-CoV-2 viral copies this assay can detect is 138 copies/mL.  A negative result does not preclude SARS-Cov-2 infection and should not be used as the sole basis for treatment or other patient management decisions. A negative result may occur with  improper specimen collection/handling, submission of specimen other than nasopharyngeal swab, presence of viral mutation(s) within the areas targeted by this assay, and inadequate number of viral copies(<138 copies/mL). A negative result must be combined with clinical observations, patient history, and epidemiological information. The expected result is Negative.  Fact Sheet for Patients:  BloggerCourse.com  Fact Sheet for Healthcare Providers:  SeriousBroker.it  This test is no t yet approved or cleared by the [place] FDA and  has been authorized for detection and/or diagnosis of SARS-CoV-2 by FDA under an Emergency Use Authorization (EUA). This EUA will remain  in effect (meaning this test can be used) for the duration of the COVID-19 declaration under Section 564(b)(1) of the Act, 21 U.S.C.section 360bbb-3(b)(1), unless the authorization is terminated  or revoked sooner.       Influenza A by PCR NEGATIVE NEGATIVE   Influenza B by PCR NEGATIVE NEGATIVE    Comment: (NOTE) The Xpert Xpress SARS-CoV-2/FLU/RSV plus assay is intended as an aid in the diagnosis of influenza from Nasopharyngeal swab specimens and should not be used as a sole basis for treatment. Nasal washings and aspirates are unacceptable for Xpert Xpress SARS-CoV-2/FLU/RSV testing.  Fact Sheet  for Patients: BloggerCourse.com  Fact Sheet for Healthcare Providers: SeriousBroker.it  This test is not yet approved or cleared by the [place] FDA and has been authorized for detection and/or diagnosis of SARS-CoV-2 by FDA under an Emergency Use Authorization (EUA). This EUA will remain in effect (meaning this test can be used)  for the duration of the COVID-19 declaration under Section 564(b)(1) of the Act, 21 U.S.C. section 360bbb-3(b)(1), unless the authorization is terminated or revoked.     Resp Syncytial Virus by PCR NEGATIVE NEGATIVE    Comment: (NOTE) Fact Sheet for Patients: BloggerCourse.com  Fact Sheet for Healthcare Providers: SeriousBroker.it  This test is not yet approved or cleared by the [place] FDA and has been authorized for detection and/or diagnosis of SARS-CoV-2 by FDA under an Emergency Use Authorization (EUA). This EUA will remain in effect (meaning this test can be used) for the duration of the COVID-19 declaration under Section 564(b)(1) of the Act, 21 U.S.C. section 360bbb-3(b)(1), unless the authorization is terminated or revoked.  Performed at [facility], [address] [place], [place] [address]   CBC with Differential     Status: Abnormal   Collection Time: 06/30/22  3:04 PM  Result Value Ref Range   WBC 6.5 4.0 - 10.5 K/uL   RBC 4.03 3.87 - 5.11 MIL/uL   Hemoglobin 11.8 (L) 12.0 - 15.0 g/dL   HCT 19.1 47.8 - 29.5 %   MCV 90.8 80.0 - 100.0 fL   MCH 29.3 26.0 - 34.0 pg   MCHC 32.2 30.0 - 36.0 g/dL   RDW 62.1 30.8 - 65.7 %   Platelets 282 150 - 400 K/uL   nRBC 0.0 0.0 - 0.2 %   Neutrophils Relative % 65 %   Neutro Abs 4.2 1.7 - 7.7 K/uL   Lymphocytes Relative 25 %   Lymphs Abs 1.6 0.7 - 4.0 K/uL   Monocytes Relative 9 %   Monocytes Absolute 0.6 0.1 - 1.0 K/uL   Eosinophils Relative 1 %   Eosinophils Absolute 0.1 0.0 - 0.5 K/uL   Basophils Relative 0 %   Basophils Absolute 0.0 0.0 - 0.1 K/uL   Immature Granulocytes 0 %   Abs Immature Granulocytes 0.01 0.00 - 0.07 K/uL    Comment: Performed at [facility], [address] [place], [place] [address]  Comprehensive metabolic panel     Status: None   Collection Time: 06/30/22  3:04 PM  Result Value Ref Range   Sodium 138 135 - 145  mmol/L   Potassium 3.8 3.5 - 5.1 mmol/L   Chloride 106 98 - 111 mmol/L   CO2 25 22 - 32 mmol/L   Glucose, Bld 96 70 - 99 mg/dL    Comment: Glucose reference range applies only to samples taken after fasting for at least 8 hours.   BUN 12 6 - 20 mg/dL   Creatinine, Ser 2.95 0.44 - 1.00 mg/dL   Calcium 9.2 8.9 - 28.4 mg/dL   Total Protein 7.8 6.5 - 8.1 g/dL   Albumin 3.9 3.5 - 5.0 g/dL   AST 24 15 - 41 U/L   ALT 18 0 - 44 U/L   Alkaline Phosphatase 105 38 - 126 U/L   Total Bilirubin 0.3 0.3 - 1.2 mg/dL   GFR, Estimated >13 >24 mL/min    Comment: (NOTE) Calculated using the CKD-EPI Creatinine Equation (2021)    Anion gap 7 5 - 15    Comment: Performed at [facility], [address] [place], [place] [address]  Lipase, blood     Status: None   Collection Time: 06/30/22  3:04 PM  Result Value Ref Range   Lipase 25 11 - 51 U/L    Comment: Performed at [facility], [address] [place], [place] [address]  Urinalysis, Routine w reflex microscopic -Urine, Clean Catch     Status: None   Collection Time: 06/30/22  5:30 PM  Result Value Ref Range   Color, Urine YELLOW YELLOW   APPearance CLEAR CLEAR   Specific Gravity, Urine 1.010 1.005 - 1.030   pH 5.0 5.0 - 8.0   Glucose, UA NEGATIVE NEGATIVE mg/dL   Hgb urine dipstick NEGATIVE NEGATIVE   Bilirubin Urine NEGATIVE NEGATIVE   Ketones, ur NEGATIVE NEGATIVE mg/dL   Protein, ur NEGATIVE NEGATIVE mg/dL   Nitrite NEGATIVE NEGATIVE   Leukocytes,Ua NEGATIVE NEGATIVE    Comment: Microscopic not done on urines with negative protein, blood, leukocytes, nitrite, or glucose < 500 mg/dL. Performed at [facility], [address] [place], [place] [address]     CT ABDOMEN PELVIS W CONTRAST  Result Date: 06/30/2022 CLINICAL DATA:  Abdominal pain, status post gastric bypass EXAM: CT ABDOMEN AND PELVIS WITH CONTRAST TECHNIQUE: Multidetector CT imaging of the abdomen and pelvis was performed using the standard  protocol following bolus administration of intravenous contrast. RADIATION DOSE REDUCTION: This exam was performed according to the departmental dose-optimization program which includes automated exposure control, adjustment of the mA and/or kV according to patient size and/or use of iterative reconstruction technique. CONTRAST:  OMNIPAQUE IOHEXOL 300 MG/ML  SOLN COMPARISON:  None Available. FINDINGS: Lower chest: No pleural or pericardial effusion. Visualized lung bases clear. Hepatobiliary: No focal liver abnormality is seen. No gallstones, gallbladder wall thickening, or biliary dilatation. Pancreas: Unremarkable. No pancreatic ductal dilatation or surrounding inflammatory changes. Spleen: Normal in size without focal abnormality. Adrenals/Urinary Tract: Adrenal glands are unremarkable. Kidneys are normal, without renal calculi, focal lesion, or hydronephrosis. Bladder is unremarkable. Stomach/Bowel: Post gastric bypass surgery. Gastric components appear decompressed. There is a left upper quadrant small bowel loop with anastomotic staples, dilated up to 7.5 cm diameter with gas/fluid level. Distal small bowel is nondilated. Normal appendix. Moderate colonic fecal material without dilatation or acute finding. Vascular/Lymphatic: No significant vascular findings are present. No enlarged abdominal or pelvic lymph nodes. Reproductive: Uterus and bilateral adnexa are unremarkable. Other: Bilateral pelvic phleboliths.  No ascites.  No free air. Musculoskeletal: Degenerative disc disease L5-S1. No acute findings. IMPRESSION: 1. Post gastric bypass surgery. Dilated left upper quadrant small bowel loop with anastomotic staples, suggesting partial obstruction. Electronically Signed   By: Corlis Leak M.D.   On: 06/30/2022 16:18   DG Chest 2 View  Result Date: 06/30/2022 CLINICAL DATA:  Cough EXAM: CHEST - 2 VIEW COMPARISON:  None Available. FINDINGS: The heart size and mediastinal contours are within normal  limits. No pleural effusion. No pneumothorax. There is hazy opacity in the right infrahilar region that could represent atelectasis or infection. The visualized skeletal structures are unremarkable. IMPRESSION: Hazy opacity in the right infrahilar region could represent atelectasis or infection. Electronically Signed   By: Lorenza Cambridge M.D.   On: 06/30/2022 14:49    ROS - all of the below systems have been reviewed with the patient and positives are indicated with bold text General: chills, fever or night sweats Eyes: blurry vision or double vision ENT: epistaxis or sore throat  Hematologic/Lymphatic: bleeding problems, blood clots or swollen lymph nodes Endocrine: temperature intolerance or unexpected weight changes Breast: new or changing breast lumps or nipple discharge Resp: cough, shortness of breath, or wheezing CV: chest pain or dyspnea on exertion GI: as per HPI GU: dysuria, trouble voiding, or hematuria Neuro: TIA or stroke symptoms    PE Blood pressure 133/72, pulse 72, temperature 98.1 F (36.7 C), temperature source Oral, resp. rate 17, height 5\' 6"  (1.676 m), weight 85.1 kg, last menstrual period 07/29/2019, SpO2 100 %. Constitutional: NAD; conversant; no deformities Eyes: Moist conjunctiva; no lid lag; anicteric; PERRL Neck: Trachea midline; no thyromegaly Lungs: Normal respiratory effort CV: RRR GI: Abd soft, TTP midepigastrum  MSK: Normal range of motion of extremities; no clubbing/cyanosis Psychiatric: Appropriate affect; alert and oriented x3  Results for orders placed or performed during the hospital encounter of 06/30/22 (from the past 48 hour(s))  Resp panel by RT-PCR (RSV, Flu A&B, Covid) Anterior Nasal Swab     Status: None   Collection Time: 06/30/22  2:34 PM   Specimen: Anterior Nasal Swab  Result Value Ref Range   SARS Coronavirus 2 by RT PCR NEGATIVE NEGATIVE    Comment: (NOTE) SARS-CoV-2 target nucleic acids are NOT DETECTED.  The SARS-CoV-2 RNA is  generally detectable in upper respiratory specimens during the acute phase of infection. The lowest concentration of SARS-CoV-2 viral copies this assay can detect is 138 copies/mL. A negative result does not preclude SARS-Cov-2 infection and should not be used as the sole basis for treatment or other patient management decisions. A negative result may occur with  improper specimen collection/handling, submission of specimen other than nasopharyngeal swab, presence of viral mutation(s) within the areas targeted by this assay, and inadequate number of viral copies(<138 copies/mL). A negative result must be combined with clinical observations, patient history, and epidemiological information. The expected result is Negative.  Fact Sheet for Patients:  BloggerCourse.com  Fact Sheet for Healthcare Providers:  SeriousBroker.it  This test is no t yet approved or cleared by the [place] FDA and  has been authorized for detection and/or diagnosis of SARS-CoV-2 by FDA under an Emergency Use Authorization (EUA). This EUA will remain  in effect (meaning this test can be used) for the duration of the COVID-19 declaration under Section 564(b)(1) of the Act, 21 U.S.C.section 360bbb-3(b)(1), unless the authorization is terminated  or revoked sooner.       Influenza A by PCR NEGATIVE NEGATIVE   Influenza B by PCR NEGATIVE NEGATIVE    Comment: (NOTE) The Xpert Xpress SARS-CoV-2/FLU/RSV plus assay is intended as an aid in the diagnosis of influenza from Nasopharyngeal swab specimens and should not be used as a sole basis for treatment. Nasal washings and aspirates are unacceptable for Xpert Xpress SARS-CoV-2/FLU/RSV testing.  Fact Sheet for Patients: BloggerCourse.com  Fact Sheet for Healthcare Providers: SeriousBroker.it  This test is not yet approved or cleared by the [place] FDA  and has been authorized for detection and/or diagnosis of SARS-CoV-2 by FDA under an Emergency Use Authorization (EUA). This EUA will remain in effect (meaning this test can be used) for the duration of the COVID-19 declaration under Section 564(b)(1) of the Act, 21 U.S.C. section 360bbb-3(b)(1), unless the authorization is terminated or revoked.     Resp Syncytial Virus by PCR NEGATIVE NEGATIVE    Comment: (NOTE) Fact Sheet for Patients: BloggerCourse.com  Fact Sheet for Healthcare Providers: SeriousBroker.it  This test is not yet approved or cleared by the [place] FDA  and has been authorized for detection and/or diagnosis of SARS-CoV-2 by FDA under an Emergency Use Authorization (EUA). This EUA will remain in effect (meaning this test can be used) for the duration of the COVID-19 declaration under Section 564(b)(1) of the Act, 21 U.S.C. section 360bbb-3(b)(1), unless the authorization is terminated or revoked.  Performed at [facility], [address] [place], [place] [address]   CBC with Differential     Status: Abnormal   Collection Time: 06/30/22  3:04 PM  Result Value Ref Range   WBC 6.5 4.0 - 10.5 K/uL   RBC 4.03 3.87 - 5.11 MIL/uL   Hemoglobin 11.8 (L) 12.0 - 15.0 g/dL   HCT 47.8 29.5 - 62.1 %   MCV 90.8 80.0 - 100.0 fL   MCH 29.3 26.0 - 34.0 pg   MCHC 32.2 30.0 - 36.0 g/dL   RDW 30.8 65.7 - 84.6 %   Platelets 282 150 - 400 K/uL   nRBC 0.0 0.0 - 0.2 %   Neutrophils Relative % 65 %   Neutro Abs 4.2 1.7 - 7.7 K/uL   Lymphocytes Relative 25 %   Lymphs Abs 1.6 0.7 - 4.0 K/uL   Monocytes Relative 9 %   Monocytes Absolute 0.6 0.1 - 1.0 K/uL   Eosinophils Relative 1 %   Eosinophils Absolute 0.1 0.0 - 0.5 K/uL   Basophils Relative 0 %   Basophils Absolute 0.0 0.0 - 0.1 K/uL   Immature Granulocytes 0 %   Abs Immature Granulocytes 0.01 0.00 - 0.07 K/uL    Comment: Performed at [facility],  [address] [place], [place] [address]  Comprehensive metabolic panel     Status: None   Collection Time: 06/30/22  3:04 PM  Result Value Ref Range   Sodium 138 135 - 145 mmol/L   Potassium 3.8 3.5 - 5.1 mmol/L   Chloride 106 98 - 111 mmol/L   CO2 25 22 - 32 mmol/L   Glucose, Bld 96 70 - 99 mg/dL    Comment: Glucose reference range applies only to samples taken after fasting for at least 8 hours.   BUN 12 6 - 20 mg/dL   Creatinine, Ser 2.84 0.44 - 1.00 mg/dL   Calcium 9.2 8.9 - 13.2 mg/dL   Total Protein 7.8 6.5 - 8.1 g/dL   Albumin 3.9 3.5 - 5.0 g/dL   AST 24 15 - 41 U/L   ALT 18 0 - 44 U/L   Alkaline Phosphatase 105 38 - 126 U/L   Total Bilirubin 0.3 0.3 - 1.2 mg/dL   GFR, Estimated >44 >01 mL/min    Comment: (NOTE) Calculated using the CKD-EPI Creatinine Equation (2021)    Anion gap 7 5 - 15    Comment: Performed at [facility], [address] [place], [place] [address]  Lipase, blood     Status: None   Collection Time: 06/30/22  3:04 PM  Result Value Ref Range   Lipase 25 11 - 51 U/L    Comment: Performed at [facility], [address] [place], [place] [address]  Urinalysis, Routine w reflex microscopic -Urine, Clean Catch     Status: None   Collection Time: 06/30/22  5:30 PM  Result Value Ref Range   Color, Urine YELLOW YELLOW   APPearance CLEAR CLEAR   Specific Gravity, Urine 1.010 1.005 - 1.030   pH 5.0 5.0 - 8.0   Glucose, UA NEGATIVE NEGATIVE mg/dL  Hgb urine dipstick NEGATIVE NEGATIVE   Bilirubin Urine NEGATIVE NEGATIVE   Ketones, ur NEGATIVE NEGATIVE mg/dL   Protein, ur NEGATIVE NEGATIVE mg/dL   Nitrite NEGATIVE NEGATIVE   Leukocytes,Ua NEGATIVE NEGATIVE    Comment: Microscopic not done on urines with negative protein, blood, leukocytes, nitrite, or glucose < 500 mg/dL. Performed at [facility], [address] [place], [place] [address]     CT ABDOMEN PELVIS W CONTRAST  Result Date: 06/30/2022 CLINICAL DATA:   Abdominal pain, status post gastric bypass EXAM: CT ABDOMEN AND PELVIS WITH CONTRAST TECHNIQUE: Multidetector CT imaging of the abdomen and pelvis was performed using the standard protocol following bolus administration of intravenous contrast. RADIATION DOSE REDUCTION: This exam was performed according to the departmental dose-optimization program which includes automated exposure control, adjustment of the mA and/or kV according to patient size and/or use of iterative reconstruction technique. CONTRAST:  OMNIPAQUE IOHEXOL 300 MG/ML  SOLN COMPARISON:  None Available. FINDINGS: Lower chest: No pleural or pericardial effusion. Visualized lung bases clear. Hepatobiliary: No focal liver abnormality is seen. No gallstones, gallbladder wall thickening, or biliary dilatation. Pancreas: Unremarkable. No pancreatic ductal dilatation or surrounding inflammatory changes. Spleen: Normal in size without focal abnormality. Adrenals/Urinary Tract: Adrenal glands are unremarkable. Kidneys are normal, without renal calculi, focal lesion, or hydronephrosis. Bladder is unremarkable. Stomach/Bowel: Post gastric bypass surgery. Gastric components appear decompressed. There is a left upper quadrant small bowel loop with anastomotic staples, dilated up to 7.5 cm diameter with gas/fluid level. Distal small bowel is nondilated. Normal appendix. Moderate colonic fecal material without dilatation or acute finding. Vascular/Lymphatic: No significant vascular findings are present. No enlarged abdominal or pelvic lymph nodes. Reproductive: Uterus and bilateral adnexa are unremarkable. Other: Bilateral pelvic phleboliths.  No ascites.  No free air. Musculoskeletal: Degenerative disc disease L5-S1. No acute findings. IMPRESSION: 1. Post gastric bypass surgery. Dilated left upper quadrant small bowel loop with anastomotic staples, suggesting partial obstruction. Electronically Signed   By: Corlis Leak M.D.   On: 06/30/2022 16:18   DG Chest  2 View  Result Date: 06/30/2022 CLINICAL DATA:  Cough EXAM: CHEST - 2 VIEW COMPARISON:  None Available. FINDINGS: The heart size and mediastinal contours are within normal limits. No pleural effusion. No pneumothorax. There is hazy opacity in the right infrahilar region that could represent atelectasis or infection. The visualized skeletal structures are unremarkable. IMPRESSION: Hazy opacity in the right infrahilar region could represent atelectasis or infection. Electronically Signed   By: Lorenza Cambridge M.D.   On: 06/30/2022 14:49     A/P: Melinda Elliott is an 52 y.o. female with dilated small bowel in her LUQ.  Signs and symptoms most consistent with marginal ulcer.  Will try some carafate this evening and admit for further evaluation.     Vanita Panda, MD  Colorectal and General Surgery [facility]  Total time of evaluation, examination, counseling and implementing medical decisions was 65 mins.  Medical decision making was straightforward decision making.

## 2022-07-01 LAB — HIV ANTIBODY (ROUTINE TESTING W REFLEX): HIV Screen 4th Generation wRfx: NONREACTIVE

## 2022-07-01 MED ORDER — ACETAMINOPHEN 325 MG PO TABS
650.0000 mg | ORAL_TABLET | Freq: Four times a day (QID) | ORAL | Status: DC | PRN
  Administered 2022-07-01 – 2022-07-04 (×3): 650 mg via ORAL
  Filled 2022-07-01 (×3): qty 2

## 2022-07-01 MED ORDER — MOMETASONE FURO-FORMOTEROL FUM 100-5 MCG/ACT IN AERO
2.0000 | INHALATION_SPRAY | Freq: Two times a day (BID) | RESPIRATORY_TRACT | Status: DC
  Administered 2022-07-01 – 2022-07-03 (×5): 2 via RESPIRATORY_TRACT
  Filled 2022-07-01: qty 8.8

## 2022-07-01 MED ORDER — OLANZAPINE 5 MG PO TABS
15.0000 mg | ORAL_TABLET | Freq: Every day | ORAL | Status: DC
  Administered 2022-07-01 – 2022-07-04 (×4): 15 mg via ORAL
  Filled 2022-07-01 (×4): qty 1

## 2022-07-01 MED ORDER — ALBUTEROL SULFATE (2.5 MG/3ML) 0.083% IN NEBU
3.0000 mL | INHALATION_SOLUTION | Freq: Four times a day (QID) | RESPIRATORY_TRACT | Status: DC | PRN
  Administered 2022-07-03: 3 mL via RESPIRATORY_TRACT
  Filled 2022-07-01: qty 3

## 2022-07-01 MED ORDER — MELATONIN 3 MG PO TABS
3.0000 mg | ORAL_TABLET | Freq: Every day | ORAL | Status: DC
  Administered 2022-07-01 – 2022-07-03 (×3): 3 mg via ORAL
  Filled 2022-07-01 (×3): qty 1

## 2022-07-01 MED ORDER — GABAPENTIN 100 MG PO CAPS
300.0000 mg | ORAL_CAPSULE | Freq: Three times a day (TID) | ORAL | Status: DC
  Administered 2022-07-01 – 2022-07-03 (×8): 300 mg via ORAL
  Filled 2022-07-01 (×9): qty 3

## 2022-07-01 MED ORDER — VALACYCLOVIR HCL 500 MG PO TABS
500.0000 mg | ORAL_TABLET | Freq: Every day | ORAL | Status: DC
  Administered 2022-07-01 – 2022-07-04 (×4): 500 mg via ORAL
  Filled 2022-07-01 (×4): qty 1

## 2022-07-01 MED ORDER — CLORAZEPATE DIPOTASSIUM 3.75 MG PO TABS: 3.7500 mg | ORAL_TABLET | Freq: Two times a day (BID) | ORAL | Status: DC | PRN

## 2022-07-01 MED ORDER — FLUVOXAMINE MALEATE 50 MG PO TABS
50.0000 mg | ORAL_TABLET | Freq: Every day | ORAL | Status: DC
  Administered 2022-07-01 – 2022-07-03 (×3): 50 mg via ORAL
  Filled 2022-07-01 (×4): qty 1

## 2022-07-01 NOTE — Consult Note (Signed)
Referring Provider: Dr. Maisie Fus Primary Care Physician:  Maurice Small, MD Primary Gastroenterologist:  Gentry Fitz  Reason for Consultation:  Abdominal pain; Early satiety  HPI: Melinda Elliott is a 52 y.o. female with history of Roux-en-Y gastric bypass in 2005 [facility]) followed reportedly by revision in 2008 and placement of "mesh" in 2011. Reports doing well without any GI issues after the 2011 procedure until 2 months ago when she started having a sensation of feeling full all the time that has worsened in the past 2 weeks. Nausea without vomiting. Postprandial pain started 2 weeks ago in her epigastric area. Also started feeling a "knot" sensation in her epigastric area. Weight has fluctuated around high 180's this year. Preop weight in the 300's. Used to use NSAIDs heavily for arthritis until 2 years ago when she switched to Tylenol. Occasional alcohol.   Past Medical History:  Diagnosis Date   Glaucoma    Migraines    Schizoaffective disorder [facility])     Past Surgical History:  Procedure Laterality Date   ABDOMINAL SURGERY     CESAREAN SECTION     GASTRIC BYPASS     HERNIA REPAIR     HYSTEROSCOPY WITH D & C N/A 08/02/2019   Procedure: DILATATION AND CURETTAGE /HYSTEROSCOPY;  Surgeon: Myna Hidalgo, DO;  Location: [facility];  Service: Gynecology;  Laterality: N/A;   LASIK      Prior to Admission medications   Medication Sig Start Date End Date Taking? Authorizing Provider  ADVAIR DISKUS 100-50 MCG/ACT AEPB Inhale 1 puff into the lungs 2 (two) times daily. 05/16/03  Yes [provider]  albuterol (VENTOLIN HFA) 108 (90 Base) MCG/ACT inhaler Inhale 2 puffs into the lungs every 6 (six) hours as needed for wheezing. 06/23/22  Yes [provider]  clorazepate (TRANXENE) 3.75 MG tablet Take 3.75 mg by mouth 2 (two) times daily as needed for anxiety.   Yes [provider]  ELDERBERRY PO Take by mouth.   Yes [provider]  fluvoxaMINE  (LUVOX) 50 MG tablet Take 50 mg by mouth at bedtime. 12/25/21  Yes [provider]  gabapentin (NEURONTIN) 300 MG capsule Take 300 mg by mouth 3 (three) times daily.   Yes [provider]  LYBALVI 20-10 MG TABS Take 1 tablet by mouth daily. 06/23/22  Yes [provider]  OLANZapine (ZYPREXA) 15 MG tablet Take 15 mg by mouth 2 (two) times daily. 12/24/21  Yes [provider]  valACYclovir (VALTREX) 500 MG tablet Take 500 mg by mouth daily. 06/23/22  Yes [provider]  ZIPRASIDONE HCL PO Take 400 mg by mouth at bedtime.   Yes [provider]    Scheduled Meds:  docusate sodium  100 mg Oral BID   pantoprazole (PROTONIX) IV  40 mg Intravenous QHS   sucralfate  1 g Oral Q6H   Continuous Infusions: PRN Meds:.diphenhydrAMINE **OR** diphenhydrAMINE, ipratropium-albuterol, morphine injection, ondansetron **OR** ondansetron (ZOFRAN) IV, oxyCODONE  Allergies as of 06/30/2022 - Review Complete 06/30/2022  Allergen Reaction Noted   Bee venom Anaphylaxis 08/18/2013   Shrimp [shellfish allergy] Anaphylaxis 03/02/2015    History reviewed. No pertinent family history.  Social History   Socioeconomic History   Marital status: Divorced    Spouse name: Not on file   Number of children: Not on file   Years of education: Not on file   Highest education level: Not on file  Occupational History   Not on file  Tobacco Use   Smoking status:  Every Day    Packs/day: .5    Types: Cigarettes   Smokeless tobacco: Never  Vaping Use   Vaping Use: Never used  Substance and Sexual Activity   Alcohol use: Yes    Comment: occ   Drug use: No   Sexual activity: Yes  Other Topics Concern   Not on file  Social History Narrative   Not on file   Social Determinants of Health   Financial Resource Strain: Not on file  Food Insecurity: Not on file  Transportation Needs: Not on file  Physical Activity: Not on file  Stress: Not on file  Social  Connections: Not on file  Intimate Partner Violence: Not on file    Review of Systems: All negative except as stated above in HPI.  Physical Exam: Vital signs: Vitals:   07/01/22 0549 07/01/22 0836  BP: 116/72 103/68  Pulse: 67 65  Resp: 18   Temp: 98.7 F (37.1 C) 97.8 F (36.6 C)  SpO2: 99% 99%   Last BM Date : 06/30/22 General:   Lethargic, Well-developed, well-nourished, pleasant and cooperative in NAD Head: normocephalic, atraumatic Eyes: anicteric sclera ENT: oropharynx clear Neck: supple, nontender Lungs:  Clear throughout to auscultation.   No wheezes, crackles, or rhonchi. No acute distress. Heart:  Regular rate and rhythm; no murmurs, clicks, rubs,  or gallops. Abdomen: epigastric, LLQ, and periumbilical tenderness with guarding, soft, nondistended, +BS Rectal:  Deferred Ext: no edema  GI:  Lab Results: Recent Labs    06/30/22 1504  WBC 6.5  HGB 11.8*  HCT 36.6  PLT 282   BMET Recent Labs    06/30/22 1504  NA 138  K 3.8  CL 106  CO2 25  GLUCOSE 96  BUN 12  CREATININE 0.66  CALCIUM 9.2   LFT Recent Labs    06/30/22 1504  PROT 7.8  ALBUMIN 3.9  AST 24  ALT 18  ALKPHOS 105  BILITOT 0.3   PT/INR No results for input(s): "LABPROT", "INR" in the last 72 hours.   Studies/Results: CT ABDOMEN PELVIS W CONTRAST  Result Date: 06/30/2022 CLINICAL DATA:  Abdominal pain, status post gastric bypass EXAM: CT ABDOMEN AND PELVIS WITH CONTRAST TECHNIQUE: Multidetector CT imaging of the abdomen and pelvis was performed using the standard protocol following bolus administration of intravenous contrast. RADIATION DOSE REDUCTION: This exam was performed according to the departmental dose-optimization program which includes automated exposure control, adjustment of the mA and/or kV according to patient size and/or use of iterative reconstruction technique. CONTRAST:  OMNIPAQUE IOHEXOL 300 MG/ML  SOLN COMPARISON:  None Available. FINDINGS: Lower chest: No  pleural or pericardial effusion. Visualized lung bases clear. Hepatobiliary: No focal liver abnormality is seen. No gallstones, gallbladder wall thickening, or biliary dilatation. Pancreas: Unremarkable. No pancreatic ductal dilatation or surrounding inflammatory changes. Spleen: Normal in size without focal abnormality. Adrenals/Urinary Tract: Adrenal glands are unremarkable. Kidneys are normal, without renal calculi, focal lesion, or hydronephrosis. Bladder is unremarkable. Stomach/Bowel: Post gastric bypass surgery. Gastric components appear decompressed. There is a left upper quadrant small bowel loop with anastomotic staples, dilated up to 7.5 cm diameter with gas/fluid level. Distal small bowel is nondilated. Normal appendix. Moderate colonic fecal material without dilatation or acute finding. Vascular/Lymphatic: No significant vascular findings are present. No enlarged abdominal or pelvic lymph nodes. Reproductive: Uterus and bilateral adnexa are unremarkable. Other: Bilateral pelvic phleboliths.  No ascites.  No free air. Musculoskeletal: Degenerative disc disease L5-S1. No acute findings. IMPRESSION: 1. Post gastric bypass surgery.  Dilated left upper quadrant small bowel loop with anastomotic staples, suggesting partial obstruction. Electronically Signed   By: Corlis Leak M.D.   On: 06/30/2022 16:18   DG Chest 2 View  Result Date: 06/30/2022 CLINICAL DATA:  Cough EXAM: CHEST - 2 VIEW COMPARISON:  None Available. FINDINGS: The heart size and mediastinal contours are within normal limits. No pleural effusion. No pneumothorax. There is hazy opacity in the right infrahilar region that could represent atelectasis or infection. The visualized skeletal structures are unremarkable. IMPRESSION: Hazy opacity in the right infrahilar region could represent atelectasis or infection. Electronically Signed   By: Lorenza Cambridge M.D.   On: 06/30/2022 14:49    Impression/Plan: Epigastric pain and early satiety in the  setting of a gastric bypass with revision as stated above. EGD needed to evaluate for anastomotic ulcer. Clear liquid diet. NPO p MN for EGD. Supportive care.    LOS: 0 days   Shirley Friar  07/01/2022, 11:14 AM  Questions please call [phone]

## 2022-07-01 NOTE — TOC CM/SW Note (Signed)
Transition of Care [facility]) Screening Note  Patient Details  Name: HARLAN BONAFEDE Date of Birth: 07-29-1970  Transition of Care [facility]) CM/SW Contact:    Ewing Schlein, LCSW Phone Number: 07/01/2022, 9:53 AM  Transition of Care Department [facility]) has reviewed patient and no TOC needs have been identified at this time. We will continue to monitor patient advancement through interdisciplinary progression rounds. If new patient transition needs arise, please place a TOC consult.

## 2022-07-01 NOTE — Progress Notes (Signed)
[facility] Progress Note     Subjective: CC:  Reports 4 weeks of early satiety and poor PO intake. Reports 2 weeks of epigastric fullness and occasionally a palpable epigastric "knot" after eating. Also has occasional cramping of upper abdomen. +flatus. Reports a normal, semi-solid, brown stool yesterday. Denies nausea or emesis.   She tells me that she has not had an upper endoscopy in many years. Tells me she had her original RNYGB in 2005 and then had a revision in 2008 and an emergent revision in 2011 for a twisting of her intestine. After 2011 she reports overall doing well until a month ago.  Objective: Vital signs in last 24 hours: Temp:  [97.8 F (36.6 C)-98.7 F (37.1 C)] 97.8 F (36.6 C) (05/21 0836) Pulse Rate:  [65-88] 65 (05/21 0836) Resp:  [14-27] 18 (05/21 0549) BP: (103-133)/(68-97) 103/68 (05/21 0836) SpO2:  [97 %-100 %] 99 % (05/21 0836) Weight:  [85.1 kg] 85.1 kg (05/20 1428) Last BM Date : 06/30/22  Intake/Output from previous day: No intake/output data recorded. Intake/Output this shift: No intake/output data recorded.  PE: Gen:  Alert, NAD, pleasant Card:  Regular rate and rhythm Pulm:  Normal effort, wearing a mask, intermittent dry cough Abd: Soft, non-tender, mild upper abdominal tenderness without guarding, no hernias or masses Skin: warm and dry, no rashes  Psych: A&Ox3   Lab Results:  Recent Labs    06/30/22 1504  WBC 6.5  HGB 11.8*  HCT 36.6  PLT 282   BMET Recent Labs    06/30/22 1504  NA 138  K 3.8  CL 106  CO2 25  GLUCOSE 96  BUN 12  CREATININE 0.66  CALCIUM 9.2   PT/INR No results for input(s): "LABPROT", "INR" in the last 72 hours. CMP     Component Value Date/Time   NA 138 06/30/2022 1504   K 3.8 06/30/2022 1504   CL 106 06/30/2022 1504   CO2 25 06/30/2022 1504   GLUCOSE 96 06/30/2022 1504   BUN 12 06/30/2022 1504   CREATININE 0.66 06/30/2022 1504   CALCIUM 9.2 06/30/2022 1504   PROT 7.8 06/30/2022  1504   ALBUMIN 3.9 06/30/2022 1504   AST 24 06/30/2022 1504   ALT 18 06/30/2022 1504   ALKPHOS 105 06/30/2022 1504   BILITOT 0.3 06/30/2022 1504   GFRNONAA >60 06/30/2022 1504   GFRAA >60 03/02/2015 1945   Lipase     Component Value Date/Time   LIPASE 25 06/30/2022 1504       Studies/Results: CT ABDOMEN PELVIS W CONTRAST  Result Date: 06/30/2022 CLINICAL DATA:  Abdominal pain, status post gastric bypass EXAM: CT ABDOMEN AND PELVIS WITH CONTRAST TECHNIQUE: Multidetector CT imaging of the abdomen and pelvis was performed using the standard protocol following bolus administration of intravenous contrast. RADIATION DOSE REDUCTION: This exam was performed according to the departmental dose-optimization program which includes automated exposure control, adjustment of the mA and/or kV according to patient size and/or use of iterative reconstruction technique. CONTRAST:  OMNIPAQUE IOHEXOL 300 MG/ML  SOLN COMPARISON:  None Available. FINDINGS: Lower chest: No pleural or pericardial effusion. Visualized lung bases clear. Hepatobiliary: No focal liver abnormality is seen. No gallstones, gallbladder wall thickening, or biliary dilatation. Pancreas: Unremarkable. No pancreatic ductal dilatation or surrounding inflammatory changes. Spleen: Normal in size without focal abnormality. Adrenals/Urinary Tract: Adrenal glands are unremarkable. Kidneys are normal, without renal calculi, focal lesion, or hydronephrosis. Bladder is unremarkable. Stomach/Bowel: Post gastric bypass surgery. Gastric components appear decompressed. There  is a left upper quadrant small bowel loop with anastomotic staples, dilated up to 7.5 cm diameter with gas/fluid level. Distal small bowel is nondilated. Normal appendix. Moderate colonic fecal material without dilatation or acute finding. Vascular/Lymphatic: No significant vascular findings are present. No enlarged abdominal or pelvic lymph nodes. Reproductive: Uterus and bilateral  adnexa are unremarkable. Other: Bilateral pelvic phleboliths.  No ascites.  No free air. Musculoskeletal: Degenerative disc disease L5-S1. No acute findings. IMPRESSION: 1. Post gastric bypass surgery. Dilated left upper quadrant small bowel loop with anastomotic staples, suggesting partial obstruction. Electronically Signed   By: Corlis Leak M.D.   On: 06/30/2022 16:18   DG Chest 2 View  Result Date: 06/30/2022 CLINICAL DATA:  Cough EXAM: CHEST - 2 VIEW COMPARISON:  None Available. FINDINGS: The heart size and mediastinal contours are within normal limits. No pleural effusion. No pneumothorax. There is hazy opacity in the right infrahilar region that could represent atelectasis or infection. The visualized skeletal structures are unremarkable. IMPRESSION: Hazy opacity in the right infrahilar region could represent atelectasis or infection. Electronically Signed   By: Lorenza Cambridge M.D.   On: 06/30/2022 14:49    Anti-infectives: Anti-infectives (From admission, onward)    None       Assessment/Plan  52 y/o F s/p RNYGB in 2005. Reports revision in 2008 for unknown reason, revision in 2011 for what she says was an internal hernia who presents with poor PO intake and dilated small bowel in LUQ.  - AFVSS,  WBC 6.5  - GI consult for upper endoscopy to further evaluate for marginal ulcer or other abnormality. Dr. Bosie Clos to see, appreciate his care. - patient has not seen her [facility] bariatric surgeons since 2011 or 2012, she is requesting to transfer her care to a Biomedical engineer in [place].  - no emergent surgical needs but will need further workup of poor PO intake and epigastric fullness/discomfort after eating.    Dry cough - respiratory panel was negative  Asthma?  Schizoaffective disorder  Glaucoma   Await med rec by pharmacy then will re-order home psychiatric and pulm meds    LOS: 0 days   I reviewed nursing notes, ED provider notes, last 24 h vitals and pain scores, last 48 h intake  and output, last 24 h labs and trends, and last 24 h imaging results.  This care required moderate level of medical decision making.   Hosie Spangle, PA-C [facility] Please see Amion for pager number during day hours 7:00am-4:30pm     \

## 2022-07-01 NOTE — H&P (View-Only) (Signed)
Referring Provider: Dr. Thomas Primary Care Physician:  Griffin, Elaine, MD Primary Gastroenterologist:  Unassigned  Reason for Consultation:  Abdominal pain; Early satiety  HPI: Melinda Elliott is a 51 y.o. female with history of Roux-en-Y gastric bypass in 2005 ([facility]) followed reportedly by revision in 2008 and placement of "mesh" in 2011. Reports doing well without any GI issues after the 2011 procedure until 2 months ago when she started having a sensation of feeling full all the time that has worsened in the past 2 weeks. Nausea without vomiting. Postprandial pain started 2 weeks ago in her epigastric area. Also started feeling a "knot" sensation in her epigastric area. Weight has fluctuated around high 180's this year. Preop weight in the 300's. Used to use NSAIDs heavily for arthritis until 2 years ago when she switched to Tylenol. Occasional alcohol.   Past Medical History:  Diagnosis Date   Glaucoma    Migraines    Schizoaffective disorder (HCC)     Past Surgical History:  Procedure Laterality Date   ABDOMINAL SURGERY     CESAREAN SECTION     GASTRIC BYPASS     HERNIA REPAIR     HYSTEROSCOPY WITH D & C N/A 08/02/2019   Procedure: DILATATION AND CURETTAGE /HYSTEROSCOPY;  Surgeon: Ozan, Jennifer, DO;  Location: [facility];  Service: Gynecology;  Laterality: N/A;   LASIK      Prior to Admission medications   Medication Sig Start Date End Date Taking? Authorizing Provider  ADVAIR DISKUS 100-50 MCG/ACT AEPB Inhale 1 puff into the lungs 2 (two) times daily. 05/16/03  Yes [provider]  albuterol (VENTOLIN HFA) 108 (90 Base) MCG/ACT inhaler Inhale 2 puffs into the lungs every 6 (six) hours as needed for wheezing. 06/23/22  Yes [provider]  clorazepate (TRANXENE) 3.75 MG tablet Take 3.75 mg by mouth 2 (two) times daily as needed for anxiety.   Yes [provider]  ELDERBERRY PO Take by mouth.   Yes [provider]  fluvoxaMINE  (LUVOX) 50 MG tablet Take 50 mg by mouth at bedtime. 12/25/21  Yes [provider]  gabapentin (NEURONTIN) 300 MG capsule Take 300 mg by mouth 3 (three) times daily.   Yes [provider]  LYBALVI 20-10 MG TABS Take 1 tablet by mouth daily. 06/23/22  Yes [provider]  OLANZapine (ZYPREXA) 15 MG tablet Take 15 mg by mouth 2 (two) times daily. 12/24/21  Yes [provider]  valACYclovir (VALTREX) 500 MG tablet Take 500 mg by mouth daily. 06/23/22  Yes [provider]  ZIPRASIDONE HCL PO Take 400 mg by mouth at bedtime.   Yes [provider]    Scheduled Meds:  docusate sodium  100 mg Oral BID   pantoprazole (PROTONIX) IV  40 mg Intravenous QHS   sucralfate  1 g Oral Q6H   Continuous Infusions: PRN Meds:.diphenhydrAMINE **OR** diphenhydrAMINE, ipratropium-albuterol, morphine injection, ondansetron **OR** ondansetron (ZOFRAN) IV, oxyCODONE  Allergies as of 06/30/2022 - Review Complete 06/30/2022  Allergen Reaction Noted   Bee venom Anaphylaxis 08/18/2013   Shrimp [shellfish allergy] Anaphylaxis 03/02/2015    History reviewed. No pertinent family history.  Social History   Socioeconomic History   Marital status: Divorced    Spouse name: Not on file   Number of children: Not on file   Years of education: Not on file   Highest education level: Not on file  Occupational History   Not on file  Tobacco Use   Smoking status:   Every Day    Packs/day: .5    Types: Cigarettes   Smokeless tobacco: Never  Vaping Use   Vaping Use: Never used  Substance and Sexual Activity   Alcohol use: Yes    Comment: occ   Drug use: No   Sexual activity: Yes  Other Topics Concern   Not on file  Social History Narrative   Not on file   Social Determinants of Health   Financial Resource Strain: Not on file  Food Insecurity: Not on file  Transportation Needs: Not on file  Physical Activity: Not on file  Stress: Not on file  Social  Connections: Not on file  Intimate Partner Violence: Not on file    Review of Systems: All negative except as stated above in HPI.  Physical Exam: Vital signs: Vitals:   07/01/22 0549 07/01/22 0836  BP: 116/72 103/68  Pulse: 67 65  Resp: 18   Temp: 98.7 F (37.1 C) 97.8 F (36.6 C)  SpO2: 99% 99%   Last BM Date : 06/30/22 General:   Lethargic, Well-developed, well-nourished, pleasant and cooperative in NAD Head: normocephalic, atraumatic Eyes: anicteric sclera ENT: oropharynx clear Neck: supple, nontender Lungs:  Clear throughout to auscultation.   No wheezes, crackles, or rhonchi. No acute distress. Heart:  Regular rate and rhythm; no murmurs, clicks, rubs,  or gallops. Abdomen: epigastric, LLQ, and periumbilical tenderness with guarding, soft, nondistended, +BS Rectal:  Deferred Ext: no edema  GI:  Lab Results: Recent Labs    06/30/22 1504  WBC 6.5  HGB 11.8*  HCT 36.6  PLT 282   BMET Recent Labs    06/30/22 1504  NA 138  K 3.8  CL 106  CO2 25  GLUCOSE 96  BUN 12  CREATININE 0.66  CALCIUM 9.2   LFT Recent Labs    06/30/22 1504  PROT 7.8  ALBUMIN 3.9  AST 24  ALT 18  ALKPHOS 105  BILITOT 0.3   PT/INR No results for input(s): "LABPROT", "INR" in the last 72 hours.   Studies/Results: CT ABDOMEN PELVIS W CONTRAST  Result Date: 06/30/2022 CLINICAL DATA:  Abdominal pain, status post gastric bypass EXAM: CT ABDOMEN AND PELVIS WITH CONTRAST TECHNIQUE: Multidetector CT imaging of the abdomen and pelvis was performed using the standard protocol following bolus administration of intravenous contrast. RADIATION DOSE REDUCTION: This exam was performed according to the departmental dose-optimization program which includes automated exposure control, adjustment of the mA and/or kV according to patient size and/or use of iterative reconstruction technique. CONTRAST:  100mL OMNIPAQUE IOHEXOL 300 MG/ML  SOLN COMPARISON:  None Available. FINDINGS: Lower chest: No  pleural or pericardial effusion. Visualized lung bases clear. Hepatobiliary: No focal liver abnormality is seen. No gallstones, gallbladder wall thickening, or biliary dilatation. Pancreas: Unremarkable. No pancreatic ductal dilatation or surrounding inflammatory changes. Spleen: Normal in size without focal abnormality. Adrenals/Urinary Tract: Adrenal glands are unremarkable. Kidneys are normal, without renal calculi, focal lesion, or hydronephrosis. Bladder is unremarkable. Stomach/Bowel: Post gastric bypass surgery. Gastric components appear decompressed. There is a left upper quadrant small bowel loop with anastomotic staples, dilated up to 7.5 cm diameter with gas/fluid level. Distal small bowel is nondilated. Normal appendix. Moderate colonic fecal material without dilatation or acute finding. Vascular/Lymphatic: No significant vascular findings are present. No enlarged abdominal or pelvic lymph nodes. Reproductive: Uterus and bilateral adnexa are unremarkable. Other: Bilateral pelvic phleboliths.  No ascites.  No free air. Musculoskeletal: Degenerative disc disease L5-S1. No acute findings. IMPRESSION: 1. Post gastric bypass surgery.   Dilated left upper quadrant small bowel loop with anastomotic staples, suggesting partial obstruction. Electronically Signed   By: D  Hassell M.D.   On: 06/30/2022 16:18   DG Chest 2 View  Result Date: 06/30/2022 CLINICAL DATA:  Cough EXAM: CHEST - 2 VIEW COMPARISON:  None Available. FINDINGS: The heart size and mediastinal contours are within normal limits. No pleural effusion. No pneumothorax. There is hazy opacity in the right infrahilar region that could represent atelectasis or infection. The visualized skeletal structures are unremarkable. IMPRESSION: Hazy opacity in the right infrahilar region could represent atelectasis or infection. Electronically Signed   By: Hemant  Desai M.D.   On: 06/30/2022 14:49    Impression/Plan: Epigastric pain and early satiety in the  setting of a gastric bypass with revision as stated above. EGD needed to evaluate for anastomotic ulcer. Clear liquid diet. NPO p MN for EGD. Supportive care.    LOS: 0 days   Javaya [place] C Luisa Louk  07/01/2022, 11:14 AM  Questions please call [phone]  

## 2022-07-02 ENCOUNTER — Encounter (HOSPITAL_COMMUNITY): Admission: EM | Disposition: A | Discharge status: Home / Self Care | Payer: Self-pay | Source: Home / Self Care

## 2022-07-02 ENCOUNTER — Observation Stay (HOSPITAL_BASED_OUTPATIENT_CLINIC_OR_DEPARTMENT_OTHER): Payer: 59 | Admitting: Anesthesiology

## 2022-07-02 ENCOUNTER — Encounter (HOSPITAL_COMMUNITY): Payer: Self-pay

## 2022-07-02 ENCOUNTER — Observation Stay (HOSPITAL_COMMUNITY): Payer: 59 | Admitting: Anesthesiology

## 2022-07-02 DIAGNOSIS — R6881 Early satiety: Secondary | ICD-10-CM

## 2022-07-02 DIAGNOSIS — E669 Obesity, unspecified: Secondary | ICD-10-CM

## 2022-07-02 DIAGNOSIS — Z6830 Body mass index (BMI) 30.0-30.9, adult: Secondary | ICD-10-CM | POA: Diagnosis not present

## 2022-07-02 DIAGNOSIS — R1013 Epigastric pain: Secondary | ICD-10-CM | POA: Diagnosis not present

## 2022-07-02 HISTORY — PX: ESOPHAGOGASTRODUODENOSCOPY (EGD) WITH PROPOFOL: SHX5813

## 2022-07-02 SURGERY — ESOPHAGOGASTRODUODENOSCOPY (EGD) WITH PROPOFOL
Anesthesia: Monitor Anesthesia Care

## 2022-07-02 MED ORDER — SODIUM CHLORIDE 0.9 % IV SOLN: INTRAVENOUS | Status: DC

## 2022-07-02 MED ORDER — LACTATED RINGERS IV SOLN: INTRAVENOUS | Status: DC

## 2022-07-02 MED ORDER — LIDOCAINE HCL (CARDIAC) PF 100 MG/5ML IV SOSY
PREFILLED_SYRINGE | INTRAVENOUS | Status: DC | PRN
  Administered 2022-07-02: 100 mg via INTRAVENOUS

## 2022-07-02 MED ORDER — PROPOFOL 10 MG/ML IV BOLUS
INTRAVENOUS | Status: DC | PRN
  Administered 2022-07-02: 60 mg via INTRAVENOUS
  Administered 2022-07-02: 40 mg via INTRAVENOUS
  Administered 2022-07-02: 50 mg via INTRAVENOUS

## 2022-07-02 SURGICAL SUPPLY — 15 items

## 2022-07-02 NOTE — Op Note (Signed)
[facility] Patient Name: Zyleigh Zeitlin Procedure Date: 07/02/2022 MRN: [id number] Attending MD: Shirley Friar , MD, [id number] Date of Birth: 1970-02-19 CSN: [id number] Age: 52 Admit Type: Inpatient Procedure:                Upper GI endoscopy Indications:              Epigastric abdominal pain, Early satiety Providers:                Shirley Friar, MD, Carlena Hurl,                            Rozetta Nunnery, Technician, Stephanie [place], CRNA Referring MD:             hospital team Medicines:                Propofol per Anesthesia, Monitored Anesthesia Care Complications:            No immediate complications. Estimated Blood Loss:     Estimated blood loss: none. Estimated blood loss:                            none. Procedure:                Pre-Anesthesia Assessment:                           - Prior to the procedure, a History and Physical                            was performed, and patient medications and                            allergies were reviewed. The patient's tolerance of                            previous anesthesia was also reviewed. The risks                            and benefits of the procedure and the sedation                            options and risks were discussed with the patient.                            All questions were answered, and informed consent                            was obtained. Prior Anticoagulants: The patient has                            taken no anticoagulant or antiplatelet agents. ASA                            Grade Assessment: II - A patient with mild systemic  disease. After reviewing the risks and benefits,                            the patient was deemed in satisfactory condition to                            undergo the procedure.                           After obtaining informed consent, the endoscope was                            passed under direct  vision. Throughout the                            procedure, the patient's blood pressure, pulse, and                            oxygen saturations were monitored continuously. The                            [id number]-H190 ([id number] Olympus endoscope was introduced                            through the mouth, and advanced to the second part                            of duodenum. The upper GI endoscopy was                            accomplished without difficulty. The patient                            tolerated the procedure well. Scope In: Scope Out: Findings:      The examined esophagus was normal.      The Z-line was regular and was found 40 cm from the incisors.      Evidence of a Roux-en-Y gastrojejunostomy was found. The gastrojejunal       anastomosis was characterized by healthy appearing mucosa. This was       traversed. The pouch-to-jejunum limb was characterized by healthy       appearing mucosa. The jejunojejunal anastomosis was characterized by       healthy appearing mucosa.      The examined jejunum was normal. Impression:               - Normal esophagus.                           - Z-line regular, 40 cm from the incisors.                           - Roux-en-Y gastrojejunostomy with gastrojejunal                            anastomosis characterized by healthy appearing  mucosa.                           - Normal examined jejunum.                           - No specimens collected. Moderate Sedation:      N/A - MAC procedure Recommendation:           - Clear liquid diet.                           - Advance diet as tolerated.                           - Observe patient's clinical course. Procedure Code(s):        --- Professional ---                           [phone], Esophagogastroduodenoscopy, flexible,                            transoral; diagnostic, including collection of                            specimen(s) by brushing or washing, when  performed                            (separate procedure) Diagnosis Code(s):        --- Professional ---                           R10.13, Epigastric pain                           R68.81, Early satiety                           Z98.0, Intestinal bypass and anastomosis status CPT copyright 2022 American Medical Association. All rights reserved. The codes documented in this report are preliminary and upon coder review may  be revised to meet current compliance requirements. Shirley Friar, MD 07/02/2022 11:47:17 AM This report has been signed electronically. Number of Addenda: 0

## 2022-07-02 NOTE — Progress Notes (Signed)
52 year old female, presents to the ER on 06/29/52 with abdominal pain and early satiety. Pt has a hx of gastric bypass surgery in 2005. Patient admitted for possible mid-marginal ulcer. CT scan completed and showed dilated left upper quadrant small bowel loop with anastomotic staples, suggesting partial obstruction. Pt made NPO for EGD that's ordered for today to confirm a marginal ulcer. Pt's bowel sounds are hypoactive in all quadrants, with tenderness in the RUQ upon palpation. Last BM was 07/01/22. EGD performed with no evidence of marginal ulcer. Provider will follow-up with bariatric surgeon for possible next steps. Patient started on a full liquid diet to advance as tolerated and will be monitored for pain.   Harrie Jeans., Student, RN

## 2022-07-02 NOTE — Anesthesia Preprocedure Evaluation (Addendum)
Anesthesia Evaluation  Patient identified by MRN, date of birth, ID band Patient awake    Reviewed: Allergy & Precautions, NPO status , Patient's Chart, lab work & pertinent test results  History of Anesthesia Complications Negative for: history of anesthetic complications  Airway Mallampati: II  TM Distance: >3 FB Neck ROM: Full    Dental  (+) Edentulous Upper, Edentulous Lower   Pulmonary Current Smoker and Patient abstained from smoking.   Pulmonary exam normal        Cardiovascular negative cardio ROS Normal cardiovascular exam     Neuro/Psych  Headaches PSYCHIATRIC DISORDERS       Schizoaffective d/o    GI/Hepatic Neg liver ROS,,, S/p gastric bypass    Endo/Other   Obesity   Renal/GU negative Renal ROS     Musculoskeletal negative musculoskeletal ROS (+)    Abdominal   Peds  Hematology  (+) Blood dyscrasia, anemia   Anesthesia Other Findings   Reproductive/Obstetrics                             Anesthesia Physical Anesthesia Plan  ASA: 2  Anesthesia Plan: MAC   Post-op Pain Management: Minimal or no pain anticipated   Induction:   PONV Risk Score and Plan: 1 and Propofol infusion and Treatment may vary due to age or medical condition  Airway Management Planned: Nasal Cannula and Natural Airway  Additional Equipment: None  Intra-op Plan:   Post-operative Plan:   Informed Consent: I have reviewed the patients History and Physical, chart, labs and discussed the procedure including the risks, benefits and alternatives for the proposed anesthesia with the patient or authorized representative who has indicated his/her understanding and acceptance.       Plan Discussed with: CRNA and Anesthesiologist  Anesthesia Plan Comments:        Anesthesia Quick Evaluation

## 2022-07-02 NOTE — Anesthesia Postprocedure Evaluation (Signed)
Anesthesia Post Note  Patient: Melinda Elliott  Procedure(s) Performed: ESOPHAGOGASTRODUODENOSCOPY (EGD) WITH PROPOFOL     Patient location during evaluation: PACU Anesthesia Type: MAC Level of consciousness: awake and alert Pain management: pain level controlled Vital Signs Assessment: post-procedure vital signs reviewed and stable Respiratory status: spontaneous breathing, nonlabored ventilation and respiratory function stable Cardiovascular status: stable and blood pressure returned to baseline Anesthetic complications: no   No notable events documented.  Last Vitals:  Vitals:   07/02/22 1205 07/02/22 1220  BP: (!) 97/57 106/67  Pulse: 60 (!) 54  Resp: 12 15  Temp:  36.5 C  SpO2: 98% 100%    Last Pain:  Vitals:   07/02/22 1220  TempSrc: Oral  PainSc:                  Beryle Lathe

## 2022-07-02 NOTE — Transfer of Care (Signed)
Immediate Anesthesia Transfer of Care Note  Patient: Melinda Elliott  Procedure(s) Performed: ESOPHAGOGASTRODUODENOSCOPY (EGD) WITH PROPOFOL  Patient Location: PACU and Endoscopy Unit  Anesthesia Type:MAC  Level of Consciousness: awake and drowsy  Airway & Oxygen Therapy: Patient Spontanous Breathing  Post-op Assessment: Report given to RN and Post -op Vital signs reviewed and stable  Post vital signs: Reviewed and stable  Last Vitals:  Vitals Value Taken Time  BP    Temp    Pulse    Resp    SpO2      Last Pain:  Vitals:   07/02/22 1035  TempSrc: Temporal  PainSc: 3       Patients Stated Pain Goal: 2 (07/01/22 1944)  Complications: No notable events documented.

## 2022-07-02 NOTE — Progress Notes (Signed)
Day of Surgery   Subjective/Chief Complaint: Just back from EGD, feels a little bloated   Objective: Vital signs in last 24 hours: Temp:  [97.5 F (36.4 C)-98.2 F (36.8 C)] 97.7 F (36.5 C) (05/22 1220) Pulse Rate:  [54-83] 54 (05/22 1220) Resp:  [12-20] 15 (05/22 1220) BP: (94-114)/(53-76) 106/67 (05/22 1220) SpO2:  [97 %-100 %] 100 % (05/22 1220) Last BM Date : 07/01/22  Intake/Output from previous day: 05/21 0701 - 05/22 0700 In: 360 [P.O.:360] Out: 300 [Urine:300] Intake/Output this shift: Total I/O In: 100 [I.V.:100] Out: -   Exam: Awake and alert Abdomen a little full, minimally tender  Lab Results:  Recent Labs    06/30/22 1504  WBC 6.5  HGB 11.8*  HCT 36.6  PLT 282   BMET Recent Labs    06/30/22 1504  NA 138  K 3.8  CL 106  CO2 25  GLUCOSE 96  BUN 12  CREATININE 0.66  CALCIUM 9.2   PT/INR No results for input(s): "LABPROT", "INR" in the last 72 hours. ABG No results for input(s): "PHART", "HCO3" in the last 72 hours.  Invalid input(s): "PCO2", "PO2"  Studies/Results: CT ABDOMEN PELVIS W CONTRAST  Result Date: 06/30/2022 CLINICAL DATA:  Abdominal pain, status post gastric bypass EXAM: CT ABDOMEN AND PELVIS WITH CONTRAST TECHNIQUE: Multidetector CT imaging of the abdomen and pelvis was performed using the standard protocol following bolus administration of intravenous contrast. RADIATION DOSE REDUCTION: This exam was performed according to the departmental dose-optimization program which includes automated exposure control, adjustment of the mA and/or kV according to patient size and/or use of iterative reconstruction technique. CONTRAST:  OMNIPAQUE IOHEXOL 300 MG/ML  SOLN COMPARISON:  None Available. FINDINGS: Lower chest: No pleural or pericardial effusion. Visualized lung bases clear. Hepatobiliary: No focal liver abnormality is seen. No gallstones, gallbladder wall thickening, or biliary dilatation. Pancreas: Unremarkable. No pancreatic  ductal dilatation or surrounding inflammatory changes. Spleen: Normal in size without focal abnormality. Adrenals/Urinary Tract: Adrenal glands are unremarkable. Kidneys are normal, without renal calculi, focal lesion, or hydronephrosis. Bladder is unremarkable. Stomach/Bowel: Post gastric bypass surgery. Gastric components appear decompressed. There is a left upper quadrant small bowel loop with anastomotic staples, dilated up to 7.5 cm diameter with gas/fluid level. Distal small bowel is nondilated. Normal appendix. Moderate colonic fecal material without dilatation or acute finding. Vascular/Lymphatic: No significant vascular findings are present. No enlarged abdominal or pelvic lymph nodes. Reproductive: Uterus and bilateral adnexa are unremarkable. Other: Bilateral pelvic phleboliths.  No ascites.  No free air. Musculoskeletal: Degenerative disc disease L5-S1. No acute findings. IMPRESSION: 1. Post gastric bypass surgery. Dilated left upper quadrant small bowel loop with anastomotic staples, suggesting partial obstruction. Electronically Signed   By: Corlis Leak M.D.   On: 06/30/2022 16:18   DG Chest 2 View  Result Date: 06/30/2022 CLINICAL DATA:  Cough EXAM: CHEST - 2 VIEW COMPARISON:  None Available. FINDINGS: The heart size and mediastinal contours are within normal limits. No pleural effusion. No pneumothorax. There is hazy opacity in the right infrahilar region that could represent atelectasis or infection. The visualized skeletal structures are unremarkable. IMPRESSION: Hazy opacity in the right infrahilar region could represent atelectasis or infection. Electronically Signed   By: Lorenza Cambridge M.D.   On: 06/30/2022 14:49    Anti-infectives: Anti-infectives (From admission, onward)    Start     Dose/Rate Route Frequency Ordered Stop   07/01/22 2200  valACYclovir (VALTREX) tablet 500 mg  500 mg Oral Daily 07/01/22 1418         Assessment/Plan: 52 y/o F s/p RNYGB in 2005. Reports  revision in 2008 for unknown reason, revision in 2011 for what she says was an internal hernia who presents with poor PO intake and dilated small bowel in LUQ.   No evidence of marginal ulcer on EGD.  Will discuss with our bariatric surgeons about what should be the next step   Abigail Miyamoto 07/02/2022

## 2022-07-02 NOTE — Interval H&P Note (Signed)
History and Physical Interval Note:  07/02/2022 11:26 AM  Melinda Elliott  has presented today for surgery, with the diagnosis of Epigastric pain; Early Satiety.  The various methods of treatment have been discussed with the patient and family. After consideration of risks, benefits and other options for treatment, the patient has consented to  Procedure(s): ESOPHAGOGASTRODUODENOSCOPY (EGD) WITH PROPOFOL (N/A) as a surgical intervention.  The patient's history has been reviewed, patient examined, no change in status, stable for surgery.  I have reviewed the patient's chart and labs.  Questions were answered to the patient's satisfaction.     Shirley Friar

## 2022-07-03 DIAGNOSIS — F17210 Nicotine dependence, cigarettes, uncomplicated: Secondary | ICD-10-CM | POA: Diagnosis present

## 2022-07-03 DIAGNOSIS — Z9884 Bariatric surgery status: Secondary | ICD-10-CM | POA: Diagnosis not present

## 2022-07-03 DIAGNOSIS — Z980 Intestinal bypass and anastomosis status: Secondary | ICD-10-CM | POA: Diagnosis not present

## 2022-07-03 DIAGNOSIS — F259 Schizoaffective disorder, unspecified: Secondary | ICD-10-CM | POA: Diagnosis present

## 2022-07-03 DIAGNOSIS — Z91030 Bee allergy status: Secondary | ICD-10-CM | POA: Diagnosis not present

## 2022-07-03 DIAGNOSIS — R1013 Epigastric pain: Secondary | ICD-10-CM | POA: Diagnosis not present

## 2022-07-03 DIAGNOSIS — Z79899 Other long term (current) drug therapy: Secondary | ICD-10-CM | POA: Diagnosis not present

## 2022-07-03 DIAGNOSIS — Z7951 Long term (current) use of inhaled steroids: Secondary | ICD-10-CM | POA: Diagnosis not present

## 2022-07-03 DIAGNOSIS — Z1152 Encounter for screening for COVID-19: Secondary | ICD-10-CM | POA: Diagnosis not present

## 2022-07-03 DIAGNOSIS — Z91013 Allergy to seafood: Secondary | ICD-10-CM | POA: Diagnosis not present

## 2022-07-03 DIAGNOSIS — K6389 Other specified diseases of intestine: Secondary | ICD-10-CM | POA: Diagnosis not present

## 2022-07-03 DIAGNOSIS — H409 Unspecified glaucoma: Secondary | ICD-10-CM | POA: Diagnosis present

## 2022-07-03 DIAGNOSIS — K56600 Partial intestinal obstruction, unspecified as to cause: Secondary | ICD-10-CM | POA: Diagnosis present

## 2022-07-03 DIAGNOSIS — R6881 Early satiety: Secondary | ICD-10-CM | POA: Diagnosis not present

## 2022-07-03 DIAGNOSIS — J189 Pneumonia, unspecified organism: Secondary | ICD-10-CM | POA: Diagnosis present

## 2022-07-03 DIAGNOSIS — R109 Unspecified abdominal pain: Secondary | ICD-10-CM | POA: Diagnosis not present

## 2022-07-03 LAB — BASIC METABOLIC PANEL
Anion gap: 10 (ref 5–15)
BUN: 5 mg/dL — ABNORMAL LOW (ref 6–20)
CO2: 21 mmol/L — ABNORMAL LOW (ref 22–32)
Calcium: 9.3 mg/dL (ref 8.9–10.3)
Chloride: 111 mmol/L (ref 98–111)
Creatinine, Ser: 0.77 mg/dL (ref 0.44–1.00)
GFR, Estimated: >60 mL/min (ref >60)
Glucose, Bld: 71 mg/dL (ref 70–99)
Potassium: 4.1 mmol/L (ref 3.5–5.1)
Sodium: 142 mmol/L (ref 135–145)

## 2022-07-03 MED ORDER — ENSURE ENLIVE PO LIQD
237.0000 mL | Freq: Three times a day (TID) | ORAL | Status: DC
  Administered 2022-07-03: 237 mL via ORAL

## 2022-07-03 NOTE — Progress Notes (Signed)
[facility] Gastroenterology Progress Note  Melinda Elliott 52 y.o. 12/02/1970   Subjective: Feels ok. Complaining of knot sensation. Wants to eat. Denies vomiting of full liquids.  Objective: Vital signs: Vitals:   07/03/22 0846 07/03/22 1343  BP:  114/73  Pulse:  60  Resp:  17  Temp:  97.7 F (36.5 C)  SpO2: 99% 100%    Physical Exam: Gen: alert, no acute distress, well-nourished, pleasant HEENT: anicteric sclera CV: RRR Chest: CTA B Abd: epigastric tenderness with guarding, soft, nondistended, +BS Ext: no edema  Lab Results: Recent Labs    07/03/22 1111  NA 142  K 4.1  CL 111  CO2 21*  GLUCOSE 71  BUN 5*  CREATININE 0.77  CALCIUM 9.3   No results for input(s): "AST", "ALT", "ALKPHOS", "BILITOT", "PROT", "ALBUMIN" in the last 72 hours. No results for input(s): "WBC", "NEUTROABS", "HGB", "HCT", "MCV", "PLT" in the last 72 hours.    Assessment/Plan: Early satiety and epigastric pain in setting of remote gastric bypass. EGD negative for anastomotic ulcer. UGIS planned for tomorrow by surgery. Would keep on full liquid diet. NPO p MN. No new GI recs. [facility] will sign off. Call us back if questions.   Shirley Friar 07/03/2022, 4:17 PM  Questions please call [phone] ID: Melinda Elliott, female   DOB: 17-Oct-1970, 52 y.o.   MRN: [id number]

## 2022-07-03 NOTE — Progress Notes (Addendum)
1 Day Post-Op   Subjective/Chief Complaint:  Denies abd pain. Tolerating clears - had one broth, one coffee, one juice over the course of 45 mins. She now has epigastric fullness and a palpable knot in her upper abd. Denies nausea or vomiting. +flatus.   Objective: Vital signs in last 24 hours: Temp:  [97.5 F (36.4 C)-97.8 F (36.6 C)] 97.8 F (36.6 C) (05/23 0606) Pulse Rate:  [50-65] 50 (05/23 0606) Resp:  [12-18] 18 (05/23 0606) BP: (94-125)/(53-70) 125/70 (05/23 0606) SpO2:  [97 %-100 %] 99 % (05/23 0846) Last BM Date : 07/01/22  Intake/Output from previous day: 05/22 0701 - 05/23 0700 In: 1654.2 [P.O.:1440; I.V.:214.2] Out: 1800 [Urine:1800] Intake/Output this shift: Total I/O In: 360 [P.O.:360] Out: 0   Exam: Awake and alert Upper abdomen with palpable epigastric fullness, minimally tender  Lab Results:  Recent Labs    06/30/22 1504  WBC 6.5  HGB 11.8*  HCT 36.6  PLT 282   BMET Recent Labs    06/30/22 1504  NA 138  K 3.8  CL 106  CO2 25  GLUCOSE 96  BUN 12  CREATININE 0.66  CALCIUM 9.2   PT/INR No results for input(s): "LABPROT", "INR" in the last 72 hours. ABG No results for input(s): "PHART", "HCO3" in the last 72 hours.  Invalid input(s): "PCO2", "PO2"  Studies/Results: No results found.  Anti-infectives: Anti-infectives (From admission, onward)    Start     Dose/Rate Route Frequency Ordered Stop   07/01/22 2200  valACYclovir (VALTREX) tablet 500 mg        500 mg Oral Daily 07/01/22 1418         Assessment/Plan: 52 y/o F s/p RNYGB in 2005. Reports revision in 2008 for unknown reason, revision in 2011 for what she says was an internal hernia who presents with poor PO intake and dilated small bowel in LUQ.   No evidence of marginal ulcer on EGD.  Discussed with one of our bariatric surgeons about  next steps - UGI with SB follow through ordered. Advance to FLD and see if she can at least tolerate 3 protein shakes per day. It this  is related to motility then we could consider a trial of reglan.  Will get UGI first.   Adam Phenix PA-C 07/03/2022

## 2022-07-04 ENCOUNTER — Inpatient Hospital Stay (HOSPITAL_COMMUNITY): Payer: 59

## 2022-07-04 MED ORDER — ACETAMINOPHEN 325 MG PO TABS: 650.0000 mg | ORAL_TABLET | Freq: Four times a day (QID) | ORAL | Status: AC | PRN

## 2022-07-04 NOTE — Progress Notes (Signed)
Reviewed written D/C instructions with pt and all questions answered. Pt verbalized understanding. Pt left in stable condition with all belongings.  

## 2022-07-04 NOTE — Discharge Summary (Signed)
[facility] Discharge Summary   Patient ID: Melinda Elliott MRN: [id number] DOB/AGE: 11-08-1970 52 y.o.  Admit date: 06/30/2022 Discharge date: 07/04/2022  Admitting Diagnosis: Abdominal pain Early satiety  PMH roux-en-Y bypass   Discharge Diagnosis Patient Active Problem List   Diagnosis Date Noted   Midepigastric pain 06/30/2022    Consultants GI - Dr. Bosie Clos   Imaging: DG UGI W SMALL BOWEL  Result Date: 07/04/2022 CLINICAL DATA:  Provided history: Abdominal pain. Additional history: History of Roux-en-Y gastric bypass. The patient reports abdominal pain and a palpable "knot" in her abdomen. The patient denies vomiting. EXAM: UPPER GI SERIES WITH SMALL BOWEL FOLLOW-THROUGH FLUOROSCOPY: Fluoroscopy time: 2 minutes 12 seconds (49.60 mGy). TECHNIQUE: A scout radiograph of the abdomen was acquired prior to the examination. A single contrast upper GI series was performed. Subsequently, serial radiographic images of the small bowel were acquired. COMPARISON:  CT abdomen/pelvis 06/30/2022. FINDINGS: A scout radiograph of the abdomen was acquired prior to the examination. Nonspecific gaseous distention of small and large bowel, predominantly within the left hemiabdomen. No acute osseous abnormality identified. Fluoroscopic evaluation demonstrated normal caliber and smooth contour of the esophagus. No evidence of fixed stricture, mass or mucosal abnormality. Normal esophageal motility was observed. No appreciable hiatal hernia. No gastroesophageal reflux was observed. Postoperative changes from prior Roux-en-Y gastric bypass. Similar to the prior CT abdomen/pelvis of 06/30/2022, a dilated loop of small bowel was noted within the left upper quadrant. However, there was prompt passage of contrast through the gastric pouch and proximal small bowel. Contrast reflux into the secretory limb was also noted. At 2 hours and 42 minutes, contrast had reached the mid small bowel. No small bowel  stricture was identified at the levels of contrast opacification. IMPRESSION: 1. Postoperative changes from prior Roux-en-Y gastric bypass. 2. Similar to the prior CT abdomen/pelvis of 06/30/2022, a dilated loop of small bowel is noted within the left upper quadrant. However, there was prompt passage of contrast through the gastric pouch and proximal small bowel. At 2 hours and 42 minutes, contrast had reached the mid small bowel. 3. Unremarkable esophagram portion of the examination. Electronically Signed   By: Jackey Loge D.O.   On: 07/04/2022 12:42    Procedures Dr. Bosie Clos 5/22 - upper endoscopy: Impression:  - Normal esophagus. - Z- line regular, 40 cm from the incisors. - Roux- en- Y gastrojejunostomy with gastrojejunal anastomosis characterized by healthy appearing mucosa. - Normal examined jejunum. - No specimens collected.  Hospital Course:  52 y/o F who reports 4 weeks of early satiety and poor PO intake. Reports 2 weeks of epigastric fullness and occasionally a palpable epigastric "knot" after eating. Also has occasional cramping of upper abdomen. +flatus/BMs. She tells me that she has not had an upper endoscopy in many years. Tells me she had her original RNYGB in 2005 and then had a revision in 2008 and an emergent revision in 2011 for a twisting of her intestine. After 2011 she reports overall doing well until a month ago. Workup included a CT which showed a dilated loop of bowel in her LUQ and a small, fat containing epigastric hernia without any bowel involvement. she was admitted for further workup.  Patient was admitted and underwent endoscopy as above. She then underwent UGI with SB follow through on 5/24. Her diet was advanced as tolerated to FLD. On 5/24 she was tolerating PO. She still had early satiety and some epigastric discomfort. Plan for outpatient follow up with Dr. Fredricka Bonine to  establish care with a bariatric surgeon in town.     Physical Exam: General:  Alert, NAD, pleasant,  comfortable Abd:  Soft, ND, mild tenderness and palpable hernia in upper abdomen, no rebound tenderness.   Allergies as of 07/04/2022       Reactions   Bee Venom Anaphylaxis   Shrimp [shellfish Allergy] Anaphylaxis   Bimatoprost Itching   Dorzolamide Hcl-timolol Mal Itching        Medication List     TAKE these medications    acetaminophen 325 MG tablet Commonly known as: TYLENOL Take 2-3 tablets (650-975 mg total) by mouth every 6 (six) hours as needed for mild pain, fever or headache.   Advair Diskus 100-50 MCG/ACT Aepb Generic drug: fluticasone-salmeterol Inhale 1 puff into the lungs 2 (two) times daily.   albuterol 108 (90 Base) MCG/ACT inhaler Commonly known as: VENTOLIN HFA Inhale 2 puffs into the lungs every 6 (six) hours as needed for wheezing.   clorazepate 3.75 MG tablet Commonly known as: TRANXENE Take 3.75 mg by mouth 2 (two) times daily as needed for anxiety.   ELDERBERRY PO Take by mouth.   fluvoxaMINE 50 MG tablet Commonly known as: LUVOX Take 50 mg by mouth at bedtime.   gabapentin 300 MG capsule Commonly known as: NEURONTIN Take 300 mg by mouth 3 (three) times daily.   Lybalvi 20-10 MG Tabs Generic drug: OLANZapine-Samidorphan Take 1 tablet by mouth daily.   OLANZapine 15 MG tablet Commonly known as: ZYPREXA Take 15 mg by mouth 2 (two) times daily.   valACYclovir 500 MG tablet Commonly known as: VALTREX Take 500 mg by mouth daily.   ZIPRASIDONE HCL PO Take 400 mg by mouth at bedtime.          Follow-up Information     Berna Bue, MD Follow up.   Specialty: General Surgery Why: our office is scheduling you for follow up. call to confirm appointment date/time. Contact information: [address] [place] [address] [phone]                 Signed: Hosie Spangle, [facility] 07/04/2022, 1:54 PM

## 2022-07-04 NOTE — Progress Notes (Signed)
300 mg of gabapentin wasted in stericycle as witnessed by Diona Browner RN.

## 2022-07-07 ENCOUNTER — Encounter (HOSPITAL_COMMUNITY): Payer: Self-pay | Admitting: Gastroenterology

## 2022-07-10 DIAGNOSIS — J45909 Unspecified asthma, uncomplicated: Secondary | ICD-10-CM | POA: Diagnosis not present

## 2022-07-10 DIAGNOSIS — R062 Wheezing: Secondary | ICD-10-CM | POA: Diagnosis not present

## 2022-07-10 DIAGNOSIS — R0602 Shortness of breath: Secondary | ICD-10-CM | POA: Diagnosis not present

## 2022-07-10 DIAGNOSIS — F17200 Nicotine dependence, unspecified, uncomplicated: Secondary | ICD-10-CM | POA: Diagnosis not present

## 2022-07-10 DIAGNOSIS — R109 Unspecified abdominal pain: Secondary | ICD-10-CM | POA: Diagnosis not present

## 2022-07-10 DIAGNOSIS — K439 Ventral hernia without obstruction or gangrene: Secondary | ICD-10-CM | POA: Diagnosis not present

## 2022-07-11 DIAGNOSIS — H04123 Dry eye syndrome of bilateral lacrimal glands: Secondary | ICD-10-CM | POA: Diagnosis not present

## 2022-07-11 DIAGNOSIS — Q112 Microphthalmos: Secondary | ICD-10-CM | POA: Diagnosis not present

## 2022-07-11 DIAGNOSIS — H402233 Chronic angle-closure glaucoma, bilateral, severe stage: Secondary | ICD-10-CM | POA: Diagnosis not present

## 2022-07-11 DIAGNOSIS — H402232 Chronic angle-closure glaucoma, bilateral, moderate stage: Secondary | ICD-10-CM | POA: Diagnosis not present

## 2022-07-11 DIAGNOSIS — H2513 Age-related nuclear cataract, bilateral: Secondary | ICD-10-CM | POA: Diagnosis not present

## 2022-07-11 DIAGNOSIS — H47323 Drusen of optic disc, bilateral: Secondary | ICD-10-CM | POA: Diagnosis not present

## 2022-07-16 ENCOUNTER — Ambulatory Visit: Payer: Self-pay | Admitting: Surgery

## 2022-07-16 DIAGNOSIS — K439 Ventral hernia without obstruction or gangrene: Secondary | ICD-10-CM | POA: Diagnosis not present

## 2022-07-16 DIAGNOSIS — Z9884 Bariatric surgery status: Secondary | ICD-10-CM | POA: Diagnosis not present

## 2022-07-16 NOTE — H&P (Signed)
Melinda Elliott [id number]   Referring Provider:  Self   Subjective   Chief Complaint: New Consultation (weightloss)     History of Present Illness:    This is a 52 year old woman who presents to establish care in the setting of prior bariatric surgery.  She was admitted to [facility] hospital about 2 weeks ago after presenting with shortness of breath, 4 weeks of abdominal pain, early satiety and dysphagia.  She has a history of Roux-en-Y gastric bypass at [facility] in 2005, and subsequently two additional surgeries there for complications in 2008 and 2011 for "twisting of her intestine".  She has not seen her bariatric surgeon there for over 10 years.  No op notes are available in care everywhere. CT Imaging initial presentation demonstrated a dilated loop of small bowel in the left upper quadrant with anastomotic staples, likely this was her GJ which tends to become patulous over time.  Additionally a very small epigastric hernia containing fat on further review.  Symptoms were consistent with marginal ulcer and therefore she was treated with PPI, Carafate and underwent upper endoscopy.  The latter was normal specifically negative for marginal ulcer. She next underwent an upper GI with small bowel follow-through which showed a similarly dilated loop in the left upper quadrant but prompt passage of contrast through to the level of the mid small bowel, including this patulous area.  I reviewed these images personally.  Denies NSAID use as about 2 years ago prior to which she has been using it heavily for arthritis, but is actively smoking.  Medical history notable for glaucoma, migraines and schizoaffective disorder.   Today she describes her symptoms as a sensation that food takes a very long time to pass, she feels like she can feel it moving through, and she notes significant bloating.  Per her significant other who is with her here today, she has been tolerating regular diet at home  fairly well although she is relying somewhat on Ensure.  Her bowel movements have been regular since leaving the hospital although prior to that she was having some constipation.  Notes a knot in the upper mid abdomen which she can feel in which bothers her especially since she has gone back to working out.   Review of Systems: A complete review of systems was obtained from the patient.  I have reviewed this information and discussed as appropriate with the patient.  See HPI as well for other ROS.   Medical History: Past Medical History:  Diagnosis Date   Anemia    Arthritis    Asthma, unspecified asthma severity, unspecified whether complicated, unspecified whether persistent (HHS-HCC)    Glaucoma (increased eye pressure)    Sleep apnea     There is no problem list on file for this patient.   History reviewed. No pertinent surgical history.   No Known Allergies  Current Outpatient Medications on File Prior to Visit  Medication Sig Dispense Refill   gabapentin (NEURONTIN) 300 MG capsule Take 1 capsule by mouth 3 (three) times daily     LYBALVI 15-10 mg Tab TAKE 1 TABLET BY MOUTH AT BEDTIME FOR MOOD OR HALLUCINATIONS     SIMBRINZA 1-0.2 % DrpS 1 drop into affected eye Ophthalmic Three times a day     No current facility-administered medications on file prior to visit.    Family History  Problem Relation Age of Onset   High blood pressure (Hypertension) Father    Diabetes Father  Social History   Tobacco Use  Smoking Status Every Day   Current packs/day: 0.50   Types: Cigarettes  Smokeless Tobacco Never     Social History   Socioeconomic History   Marital status: Divorced  Tobacco Use   Smoking status: Every Day    Current packs/day: 0.50    Types: Cigarettes   Smokeless tobacco: Never  Substance and Sexual Activity   Alcohol use: Not Currently   Drug use: Never    Objective:    Vitals:   07/16/22 1410  BP: 104/75  Pulse: 97  Temp: 36.1 C (97  F)  SpO2: 99%  Weight: 85.5 kg (188 lb 9.6 oz)  Height: 165.1 cm (5\' 5" )    Body mass index is 31.38 kg/m.  Gen: A&Ox3, no distress  Eyes: lids and conjunctivae normal, no icterus. Pupils equally round and reactive to light.  Neck: supple without mass or thyromegaly Chest: respiratory effort is normal.  Cardiovascular: RRR with palpable distal pulses, no pedal edema Gastrointestinal: soft, nondistended, nontender.  There is a palpable approximately 3 cm mass in the central midline, about halfway between the umbilicus and the xiphoid consistent with a chronically incarcerated epigastric hernia.  This is moderately tender. Muscoloskeletal: no clubbing or cyanosis of the fingers.  Strength is symmetrical throughout.  Range of motion of bilateral upper and lower extremities normal without pain, crepitation or contracture. Neuro: cranial nerves grossly intact.  Sensation intact to light touch diffusely. Psych: appropriate mood and affect, normal insight/judgment intact  Skin: warm and dry    Assessment and Plan:  Diagnoses and all orders for this visit:  History of Roux-en-Y gastric bypass -     Iron and Total Iron Binding Capacity (TIBC); Future -     Vitamin D, 25-Hydroxy - [facility] -     Vitamin B12 - [facility] -     Vitamin B1 (Thiamine), Blood - [facility] -     Thyroid Stimulating Hormone (TSH); Future -     Folate; Future -     Zinc, Plasma or Serum - [facility] -     Vitamin A, Serum - [facility] -     Copper, Serum - [facility] -     Selenium, Serum/Plasma - [facility] -     Parathyroid Hormone (PTH); Future  Epigastric hernia    I discussed with her options of diagnostic laparoscopy to ensure no adhesive band or other etiology of her GI symptoms, and she is very reluctant to consider this at this time.  She does however wish to have her epigastric hernia repaired and it will be interesting to see whether that has any significant impact on her overall symptoms and quality of life.  I  discussed this procedure with her in detail and went over the risks of bleeding, infection, pain, scarring, intra-abdominal injury, seroma/hematoma, hernia recurrence, as well as postoperative recovery, activity limitations and timeline.  Questions welcomed and answered to her satisfaction.  In the meantime, we will check bariatric follow-up labs since she has not met with the bariatric surgeon in a decade and we will request operative notes from her surgeons at [facility] from her bypass as well as the 2 subsequent revisions and any other intra-abdominal surgery.  Lidwina Kaner Carlye Grippe, MD

## 2022-07-16 NOTE — H&P (View-Only) (Signed)
   Melinda Elliott [id number]   Referring Provider:  Self   Subjective   Chief Complaint: New Consultation (weightloss)     History of Present Illness:    This is a 52-year-old woman who presents to establish care in the setting of prior bariatric surgery.  She was admitted to [place] [facility] about 2 weeks ago after presenting with shortness of breath, 4 weeks of abdominal pain, early satiety and dysphagia.  She has a history of Roux-en-Y gastric bypass at [facility] in 2005, and subsequently two additional surgeries there for complications in 2008 and 2011 for "twisting of her intestine".  She has not seen her bariatric surgeon there for over 10 years.  No op notes are available in care everywhere. CT Imaging initial presentation demonstrated a dilated loop of small bowel in the left upper quadrant with anastomotic staples, likely this was her GJ which tends to become patulous over time.  Additionally a very small epigastric hernia containing fat on further review.  Symptoms were consistent with marginal ulcer and therefore she was treated with PPI, Carafate and underwent upper endoscopy.  The latter was normal specifically negative for marginal ulcer. She next underwent an upper GI with small bowel follow-through which showed a similarly dilated loop in the left upper quadrant but prompt passage of contrast through to the level of the mid small bowel, including this patulous area.  I reviewed these images personally.  Denies NSAID use as about 2 years ago prior to which she has been using it heavily for arthritis, but is actively smoking.  Medical history notable for glaucoma, migraines and schizoaffective disorder.   Today she describes her symptoms as a sensation that food takes a very long time to pass, she feels like she can feel it moving through, and she notes significant bloating.  Per her significant other who is with her here today, she has been tolerating regular diet at home  fairly well although she is relying somewhat on Ensure.  Her bowel movements have been regular since leaving the hospital although prior to that she was having some constipation.  Notes a knot in the upper mid abdomen which she can feel in which bothers her especially since she has gone back to working out.   Review of Systems: A complete review of systems was obtained from the patient.  I have reviewed this information and discussed as appropriate with the patient.  See HPI as well for other ROS.   Medical History: Past Medical History:  Diagnosis Date   Anemia    Arthritis    Asthma, unspecified asthma severity, unspecified whether complicated, unspecified whether persistent (HHS-HCC)    Glaucoma (increased eye pressure)    Sleep apnea     There is no problem list on file for this patient.   History reviewed. No pertinent surgical history.   No Known Allergies  Current Outpatient Medications on File Prior to Visit  Medication Sig Dispense Refill   gabapentin (NEURONTIN) 300 MG capsule Take 1 capsule by mouth 3 (three) times daily     LYBALVI 15-10 mg Tab TAKE 1 TABLET BY MOUTH AT BEDTIME FOR MOOD OR HALLUCINATIONS     SIMBRINZA 1-0.2 % DrpS 1 drop into affected eye Ophthalmic Three times a day     No current facility-administered medications on file prior to visit.    Family History  Problem Relation Age of Onset   High blood pressure (Hypertension) Father    Diabetes Father        Social History   Tobacco Use  Smoking Status Every Day   Current packs/day: 0.50   Types: Cigarettes  Smokeless Tobacco Never     Social History   Socioeconomic History   Marital status: Divorced  Tobacco Use   Smoking status: Every Day    Current packs/day: 0.50    Types: Cigarettes   Smokeless tobacco: Never  Substance and Sexual Activity   Alcohol use: Not Currently   Drug use: Never    Objective:    Vitals:   07/16/22 1410  BP: 104/75  Pulse: 97  Temp: 36.1 C (97  F)  SpO2: 99%  Weight: 85.5 kg (188 lb 9.6 oz)  Height: 165.1 cm (5' 5")    Body mass index is 31.38 kg/m.  Gen: A&Ox3, no distress  Eyes: lids and conjunctivae normal, no icterus. Pupils equally round and reactive to light.  Neck: supple without mass or thyromegaly Chest: respiratory effort is normal.  Cardiovascular: RRR with palpable distal pulses, no pedal edema Gastrointestinal: soft, nondistended, nontender.  There is a palpable approximately 3 cm mass in the central midline, about halfway between the umbilicus and the xiphoid consistent with a chronically incarcerated epigastric hernia.  This is moderately tender. Muscoloskeletal: no clubbing or cyanosis of the fingers.  Strength is symmetrical throughout.  Range of motion of bilateral upper and lower extremities normal without pain, crepitation or contracture. Neuro: cranial nerves grossly intact.  Sensation intact to light touch diffusely. Psych: appropriate mood and affect, normal insight/judgment intact  Skin: warm and dry    Assessment and Plan:  Diagnoses and all orders for this visit:  History of Roux-en-Y gastric bypass -     Iron and Total Iron Binding Capacity (TIBC); Future -     Vitamin D, 25-Hydroxy - [facility] -     Vitamin B12 - [facility] -     Vitamin B1 (Thiamine), Blood - [facility] -     Thyroid Stimulating Hormone (TSH); Future -     Folate; Future -     Zinc, Plasma or Serum - [facility] -     Vitamin A, Serum - [facility] -     Copper, Serum - [facility] -     Selenium, Serum/Plasma - [facility] -     Parathyroid Hormone (PTH); Future  Epigastric hernia    I discussed with her options of diagnostic laparoscopy to ensure no adhesive band or other etiology of her GI symptoms, and she is very reluctant to consider this at this time.  She does however wish to have her epigastric hernia repaired and it will be interesting to see whether that has any significant impact on her overall symptoms and quality of life.  I  discussed this procedure with her in detail and went over the risks of bleeding, infection, pain, scarring, intra-abdominal injury, seroma/hematoma, hernia recurrence, as well as postoperative recovery, activity limitations and timeline.  Questions welcomed and answered to her satisfaction.  In the meantime, we will check bariatric follow-up labs since she has not met with the bariatric surgeon in a decade and we will request operative notes from her surgeons at [facility] from her bypass as well as the 2 subsequent revisions and any other intra-abdominal surgery.  Asianna Brundage AMANDA Darrol Brandenburg, MD      

## 2022-07-18 ENCOUNTER — Encounter (HOSPITAL_BASED_OUTPATIENT_CLINIC_OR_DEPARTMENT_OTHER): Payer: Self-pay | Admitting: Surgery

## 2022-07-18 ENCOUNTER — Other Ambulatory Visit: Payer: Self-pay

## 2022-07-18 DIAGNOSIS — Z9884 Bariatric surgery status: Secondary | ICD-10-CM | POA: Diagnosis not present

## 2022-07-18 DIAGNOSIS — D649 Anemia, unspecified: Secondary | ICD-10-CM | POA: Diagnosis not present

## 2022-07-18 DIAGNOSIS — G478 Other sleep disorders: Secondary | ICD-10-CM | POA: Diagnosis not present

## 2022-07-18 NOTE — Progress Notes (Signed)
Spoke w/ via phone for pre-op interview---pt Lab needs dos----      none         Lab results------cbc with dif 06-30-2022 epic, bmet 07-03-2022 epic COVID test -----patient states asymptomatic no test needed Arrive at -------1315 07-29-2022 NPO after MN NO Solid Food.  Clear liquids from MN until---1215 pm Med rec completed Medications to take morning of surgery -----albuterol inhaler prn/bring inhaler, breztri, clorazepate, gabapentin Diabetic medication -----n/a Patient instructed no nail polish to be worn day of surgery Patient instructed to bring photo id and insurance card day of surgery Patient aware to have Driver (ride ) / caregiver   Melinda Elliott family  for 24 hours after surgery  Patient Special Instructions -----none Pre-Op special Instructions -----none Patient verbalized understanding of instructions that were given at this phone interview. Patient denies shortness of breath, chest pain, fever, cough at this phone interview.

## 2022-07-21 DIAGNOSIS — Z136 Encounter for screening for cardiovascular disorders: Secondary | ICD-10-CM | POA: Diagnosis not present

## 2022-07-21 DIAGNOSIS — J4520 Mild intermittent asthma, uncomplicated: Secondary | ICD-10-CM | POA: Diagnosis not present

## 2022-07-21 DIAGNOSIS — D509 Iron deficiency anemia, unspecified: Secondary | ICD-10-CM | POA: Diagnosis not present

## 2022-07-21 DIAGNOSIS — G43909 Migraine, unspecified, not intractable, without status migrainosus: Secondary | ICD-10-CM | POA: Diagnosis not present

## 2022-07-21 DIAGNOSIS — F17210 Nicotine dependence, cigarettes, uncomplicated: Secondary | ICD-10-CM | POA: Diagnosis not present

## 2022-07-21 DIAGNOSIS — Z0000 Encounter for general adult medical examination without abnormal findings: Secondary | ICD-10-CM | POA: Diagnosis not present

## 2022-07-21 DIAGNOSIS — R7303 Prediabetes: Secondary | ICD-10-CM | POA: Diagnosis not present

## 2022-07-29 ENCOUNTER — Ambulatory Visit (HOSPITAL_BASED_OUTPATIENT_CLINIC_OR_DEPARTMENT_OTHER)
Admission: RE | Admit: 2022-07-29 | Discharge: 2022-07-29 | Disposition: A | Discharge status: Home / Self Care | Payer: 59 | Attending: Surgery | Admitting: Surgery

## 2022-07-29 ENCOUNTER — Other Ambulatory Visit: Payer: Self-pay

## 2022-07-29 ENCOUNTER — Encounter (HOSPITAL_BASED_OUTPATIENT_CLINIC_OR_DEPARTMENT_OTHER)
Admission: RE | Disposition: A | Discharge status: Home / Self Care | Payer: Self-pay | Source: Home / Self Care | Attending: Surgery

## 2022-07-29 ENCOUNTER — Ambulatory Visit (HOSPITAL_BASED_OUTPATIENT_CLINIC_OR_DEPARTMENT_OTHER): Payer: 59 | Admitting: Anesthesiology

## 2022-07-29 ENCOUNTER — Encounter (HOSPITAL_BASED_OUTPATIENT_CLINIC_OR_DEPARTMENT_OTHER): Payer: Self-pay | Admitting: Surgery

## 2022-07-29 DIAGNOSIS — J45909 Unspecified asthma, uncomplicated: Secondary | ICD-10-CM | POA: Diagnosis not present

## 2022-07-29 DIAGNOSIS — K436 Other and unspecified ventral hernia with obstruction, without gangrene: Secondary | ICD-10-CM | POA: Insufficient documentation

## 2022-07-29 DIAGNOSIS — F17210 Nicotine dependence, cigarettes, uncomplicated: Secondary | ICD-10-CM | POA: Insufficient documentation

## 2022-07-29 DIAGNOSIS — Z9884 Bariatric surgery status: Secondary | ICD-10-CM | POA: Insufficient documentation

## 2022-07-29 HISTORY — PX: EPIGASTRIC HERNIA REPAIR: SHX404

## 2022-07-29 HISTORY — DX: Unspecified cataract: H26.9

## 2022-07-29 HISTORY — DX: Anxiety disorder, unspecified: F41.9

## 2022-07-29 HISTORY — DX: Cardiac murmur, unspecified: R01.1

## 2022-07-29 HISTORY — DX: Presence of spectacles and contact lenses: Z97.3

## 2022-07-29 HISTORY — DX: Presence of dental prosthetic device (complete) (partial): Z97.2

## 2022-07-29 HISTORY — DX: Depression, unspecified: F32.A

## 2022-07-29 HISTORY — DX: Unspecified asthma, uncomplicated: J45.909

## 2022-07-29 SURGERY — REPAIR, HERNIA, EPIGASTRIC, ADULT
Anesthesia: General | Site: Abdomen

## 2022-07-29 MED ORDER — GABAPENTIN 300 MG PO CAPS
ORAL_CAPSULE | ORAL | Status: AC
  Filled 2022-07-29: qty 1

## 2022-07-29 MED ORDER — ACETAMINOPHEN 500 MG PO TABS
ORAL_TABLET | ORAL | Status: AC
  Filled 2022-07-29: qty 2

## 2022-07-29 MED ORDER — SUGAMMADEX SODIUM 200 MG/2ML IV SOLN
INTRAVENOUS | Status: DC | PRN
  Administered 2022-07-29: 200 mg via INTRAVENOUS

## 2022-07-29 MED ORDER — ACETAMINOPHEN 500 MG PO TABS
1000.0000 mg | ORAL_TABLET | ORAL | Status: AC
  Administered 2022-07-29: 1000 mg via ORAL

## 2022-07-29 MED ORDER — BUPIVACAINE LIPOSOME 1.3 % IJ SUSP: 20.0000 mL | Freq: Once | INTRAMUSCULAR | Status: DC

## 2022-07-29 MED ORDER — ONDANSETRON HCL 4 MG/2ML IJ SOLN
INTRAMUSCULAR | Status: DC | PRN
  Administered 2022-07-29: 4 mg via INTRAVENOUS

## 2022-07-29 MED ORDER — GABAPENTIN 300 MG PO CAPS
300.0000 mg | ORAL_CAPSULE | ORAL | Status: AC
  Administered 2022-07-29: 300 mg via ORAL

## 2022-07-29 MED ORDER — FENTANYL CITRATE (PF) 100 MCG/2ML IJ SOLN: 25.0000 ug | INTRAMUSCULAR | Status: DC | PRN

## 2022-07-29 MED ORDER — MIDAZOLAM HCL 2 MG/2ML IJ SOLN
INTRAMUSCULAR | Status: DC | PRN
  Administered 2022-07-29: 2 mg via INTRAVENOUS

## 2022-07-29 MED ORDER — FENTANYL CITRATE (PF) 100 MCG/2ML IJ SOLN
INTRAMUSCULAR | Status: DC | PRN
  Administered 2022-07-29: 50 ug via INTRAVENOUS
  Administered 2022-07-29 (×2): 25 ug via INTRAVENOUS

## 2022-07-29 MED ORDER — CEFAZOLIN SODIUM-DEXTROSE 2-4 GM/100ML-% IV SOLN
INTRAVENOUS | Status: AC
  Filled 2022-07-29: qty 100

## 2022-07-29 MED ORDER — AMISULPRIDE (ANTIEMETIC) 5 MG/2ML IV SOLN
10.0000 mg | Freq: Once | INTRAVENOUS | Status: AC | PRN
  Administered 2022-07-29: 10 mg via INTRAVENOUS

## 2022-07-29 MED ORDER — CHLORHEXIDINE GLUCONATE 4 % EX SOLN: 60.0000 mL | Freq: Once | CUTANEOUS | Status: DC

## 2022-07-29 MED ORDER — LIDOCAINE HCL (PF) 2 % IJ SOLN
INTRAMUSCULAR | Status: AC
  Filled 2022-07-29: qty 5

## 2022-07-29 MED ORDER — PROPOFOL 10 MG/ML IV BOLUS
INTRAVENOUS | Status: AC
  Filled 2022-07-29: qty 20

## 2022-07-29 MED ORDER — FENTANYL CITRATE (PF) 100 MCG/2ML IJ SOLN
INTRAMUSCULAR | Status: AC
  Filled 2022-07-29: qty 2

## 2022-07-29 MED ORDER — LACTATED RINGERS IV SOLN: INTRAVENOUS | Status: DC

## 2022-07-29 MED ORDER — BUPIVACAINE-EPINEPHRINE 0.25% -1:200000 IJ SOLN
INTRAMUSCULAR | Status: DC | PRN
  Administered 2022-07-29: 30 mL

## 2022-07-29 MED ORDER — 0.9 % SODIUM CHLORIDE (POUR BTL) OPTIME
TOPICAL | Status: DC | PRN
  Administered 2022-07-29: 500 mL

## 2022-07-29 MED ORDER — DOCUSATE SODIUM 100 MG PO CAPS: 100.0000 mg | ORAL_CAPSULE | Freq: Two times a day (BID) | ORAL | 0 refills | Status: AC

## 2022-07-29 MED ORDER — ROCURONIUM BROMIDE 10 MG/ML (PF) SYRINGE
PREFILLED_SYRINGE | INTRAVENOUS | Status: DC | PRN
  Administered 2022-07-29: 50 mg via INTRAVENOUS

## 2022-07-29 MED ORDER — DEXAMETHASONE SODIUM PHOSPHATE 10 MG/ML IJ SOLN
INTRAMUSCULAR | Status: DC | PRN
  Administered 2022-07-29: 5 mg via INTRAVENOUS

## 2022-07-29 MED ORDER — LIDOCAINE 2% (20 MG/ML) 5 ML SYRINGE
INTRAMUSCULAR | Status: DC | PRN
  Administered 2022-07-29: 60 mg via INTRAVENOUS

## 2022-07-29 MED ORDER — AMISULPRIDE (ANTIEMETIC) 5 MG/2ML IV SOLN
INTRAVENOUS | Status: AC
  Filled 2022-07-29: qty 4

## 2022-07-29 MED ORDER — MIDAZOLAM HCL 2 MG/2ML IJ SOLN
INTRAMUSCULAR | Status: AC
  Filled 2022-07-29: qty 2

## 2022-07-29 MED ORDER — PROPOFOL 10 MG/ML IV BOLUS
INTRAVENOUS | Status: DC | PRN
  Administered 2022-07-29: 150 mg via INTRAVENOUS

## 2022-07-29 MED ORDER — CEFAZOLIN SODIUM-DEXTROSE 2-4 GM/100ML-% IV SOLN
2.0000 g | INTRAVENOUS | Status: AC
  Administered 2022-07-29: 2 g via INTRAVENOUS

## 2022-07-29 MED ORDER — OXYCODONE HCL 5 MG PO TABS: 5.0000 mg | ORAL_TABLET | Freq: Three times a day (TID) | ORAL | 0 refills | Status: AC | PRN

## 2022-07-29 SURGICAL SUPPLY — 51 items
APL PRP STRL LF DISP 70% ISPRP (MISCELLANEOUS) ×1
APL SKNCLS STERI-STRIP NONHPOA (GAUZE/BANDAGES/DRESSINGS)
BAG COUNTER SPONGE SURGICOUNT (BAG) ×1 IMPLANT
BAG SPNG CNTER NS LX DISP (BAG) ×1
BALL CTTN LRG ABS STRL LF (GAUZE/BANDAGES/DRESSINGS) ×1
BENZOIN TINCTURE PRP APPL 2/3 (GAUZE/BANDAGES/DRESSINGS) ×1 IMPLANT
BLADE CLIPPER SENSICLIP SURGIC (BLADE) IMPLANT
BLADE SURG 15 STRL LF DISP TIS (BLADE) ×1 IMPLANT
BLADE SURG 15 STRL SS (BLADE) ×1
CHLORAPREP W/TINT 26 (MISCELLANEOUS) ×1 IMPLANT
COTTONBALL LRG STERILE PKG (GAUZE/BANDAGES/DRESSINGS) ×1 IMPLANT
COVER BACK TABLE 60X90IN (DRAPES) ×1 IMPLANT
COVER MAYO STAND STRL (DRAPES) ×1 IMPLANT
DRAPE LAPAROTOMY 100X72 PEDS (DRAPES) ×1 IMPLANT
DRAPE UTILITY XL STRL (DRAPES) ×1 IMPLANT
DRSG TEGADERM 4X4.75 (GAUZE/BANDAGES/DRESSINGS) ×1 IMPLANT
ELECT REM PT RETURN 9FT ADLT (ELECTROSURGICAL) ×1
ELECTRODE REM PT RTRN 9FT ADLT (ELECTROSURGICAL) ×1 IMPLANT
GAUZE 4X4 16PLY ~~LOC~~+RFID DBL (SPONGE) IMPLANT
GAUZE SPONGE 4X4 12PLY STRL (GAUZE/BANDAGES/DRESSINGS) IMPLANT
GAUZE SPONGE 4X4 12PLY STRL LF (GAUZE/BANDAGES/DRESSINGS) ×1 IMPLANT
GLOVE BIO SURGEON STRL SZ 6 (GLOVE) ×1 IMPLANT
GLOVE BIO SURGEON STRL SZ7 (GLOVE) IMPLANT
GLOVE BIOGEL PI IND STRL 7.0 (GLOVE) IMPLANT
GLOVE INDICATOR 6.5 STRL GRN (GLOVE) ×1 IMPLANT
GLOVE SURG POLYISO LF SZ5.5 (GLOVE) IMPLANT
GOWN STRL REUS W/TWL LRG LVL3 (GOWN DISPOSABLE) ×1 IMPLANT
KIT TURNOVER CYSTO (KITS) ×1 IMPLANT
NDL HYPO 22X1.5 SAFETY MO (MISCELLANEOUS) ×1 IMPLANT
NEEDLE HYPO 22X1.5 SAFETY MO (MISCELLANEOUS) ×1 IMPLANT
NS IRRIG 500ML POUR BTL (IV SOLUTION) IMPLANT
PACK BASIN DAY SURGERY FS (CUSTOM PROCEDURE TRAY) ×1 IMPLANT
PENCIL SMOKE EVACUATOR (MISCELLANEOUS) ×1 IMPLANT
SLEEVE SCD COMPRESS KNEE MED (STOCKING) ×2 IMPLANT
SPIKE FLUID TRANSFER (MISCELLANEOUS) IMPLANT
STRIP CLOSURE SKIN 1/2X4 (GAUZE/BANDAGES/DRESSINGS) ×1 IMPLANT
SUT ETHIBOND 0 MO6 C/R (SUTURE) ×1 IMPLANT
SUT MNCRL AB 4-0 PS2 18 (SUTURE) ×1 IMPLANT
SUT PROLENE 2 0 CT2 30 (SUTURE) IMPLANT
SUT VIC AB 0 SH 27 (SUTURE) IMPLANT
SUT VIC AB 2-0 SH 27 (SUTURE)
SUT VIC AB 2-0 SH 27XBRD (SUTURE) IMPLANT
SUT VIC AB 3-0 SH 27 (SUTURE) ×1
SUT VIC AB 3-0 SH 27X BRD (SUTURE) ×1 IMPLANT
SUT VIC AB 3-0 SH 27XBRD (SUTURE) IMPLANT
SUT VICRYL 3 0 BR 18 UND (SUTURE) IMPLANT
SYR BULB IRRIG 60ML STRL (SYRINGE) IMPLANT
SYR CONTROL 10ML LL (SYRINGE) ×1 IMPLANT
TOWEL OR 17X24 6PK STRL BLUE (TOWEL DISPOSABLE) ×1 IMPLANT
TUBE CONNECTING 12X1/4 (SUCTIONS) ×1 IMPLANT
YANKAUER SUCT BULB TIP NO VENT (SUCTIONS) ×1 IMPLANT

## 2022-07-29 NOTE — Transfer of Care (Signed)
Immediate Anesthesia Transfer of Care Note  Patient: Melinda Elliott  Procedure(s) Performed: Procedure(s) (LRB): HERNIA REPAIR EPIGASTRIC ADULT (N/A)  Patient Location: PACU  Anesthesia Type: General  Level of Consciousness: awake, oriented, sedated and patient cooperative  Airway & Oxygen Therapy: Patient Spontanous Breathing and Patient connected to face mask oxygen  Post-op Assessment: Report given to PACU RN and Post -op Vital signs reviewed and stable  Post vital signs: Reviewed and stable  Complications: No apparent anesthesia complications Last Vitals:  Vitals Value Taken Time  BP 137/72 07/29/22 1607  Temp    Pulse 93 07/29/22 1611  Resp 20 07/29/22 1611  SpO2 100 % 07/29/22 1611  Vitals shown include unvalidated device data.  Last Pain:  Vitals:   07/29/22 1321  TempSrc: Oral  PainSc: 0-No pain      Patients Stated Pain Goal: 3 (07/29/22 1321)  Complications: No notable events documented.

## 2022-07-29 NOTE — Anesthesia Postprocedure Evaluation (Signed)
Anesthesia Post Note  Patient: Melinda Elliott  Procedure(s) Performed: HERNIA REPAIR EPIGASTRIC ADULT (Abdomen)     Patient location during evaluation: PACU Anesthesia Type: General Level of consciousness: awake and alert Pain management: pain level controlled Vital Signs Assessment: post-procedure vital signs reviewed and stable Respiratory status: spontaneous breathing, nonlabored ventilation, respiratory function stable and patient connected to nasal cannula oxygen Cardiovascular status: blood pressure returned to baseline and stable : nausea improved with barhemsys. Anesthetic complications: no   No notable events documented.  Last Vitals:  Vitals:   07/29/22 1630 07/29/22 1645  BP: 123/78 130/71  Pulse: 85 82  Resp: 12 18  Temp:    SpO2: 100% 100%    Last Pain:  Vitals:   07/29/22 1630  TempSrc:   PainSc: 0-No pain                 Beryle Lathe

## 2022-07-29 NOTE — Op Note (Signed)
Operative Note  SANAE FILO  [id number]  [id number]  07/29/2022   Surgeon: Phylliss Blakes MD FACS   Assistant: Margarito Courser MD ([id number]) I was personally present during the key and critical portions of this procedure and immediately available throughout the entire procedure, as documented in my operative note.    Procedure performed: Open primary repair of chronically incarcerated epigastric hernia, defect 8mm   Preop diagnosis: Chronically incarcerated epigastric hernia Post-op diagnosis/intraop findings: Same, containing preperitoneal fat, defect 8 mm   Specimens: no Retained items: no  EBL: minimal cc Complications: none   Description of procedure: After obtaining informed consent the patient was taken to the operating room and placed supine on operating room table where general endotracheal anesthesia was initiated, preoperative antibiotics were administered, SCDs applied, and a formal timeout was performed.  The abdomen was prepped and draped in usual sterile fashion.  After infiltration with local, a small vertical incision was made over the palpable mass in the epigastrium and the soft tissue dissected with cautery until the hernia sac was encountered.  This was circumferentially dissected to the level of the fascia.  This appeared to consist of preperitoneal fat.  Some of this was excised in order to allow the remainder to be reduced.  The fascia was then cleared off and the defect measured 8 mm in diameter.  This was closed transversely with simple interrupted 0 Ethibond's.  Additional local was infiltrated in the fascia and subcutaneous tissue.  The deep soft tissues were reapproximated with interrupted 3-0 Vicryl's and the skin was closed with running subcuticular 4-0 Monocryl followed by benzoin, Steri-Strips and a dry gauze dressing with tape.  The patient was then awakened, extubated and taken to PACU in stable condition.    All counts were correct at the completion of the case.

## 2022-07-29 NOTE — Anesthesia Preprocedure Evaluation (Signed)
Anesthesia Evaluation  Patient identified by MRN, date of birth, ID band Patient awake    Reviewed: Allergy & Precautions, NPO status , Patient's Chart, lab work & pertinent test results  Airway Mallampati: II  TM Distance: >3 FB Neck ROM: Full    Dental  (+) Dental Advisory Given   Pulmonary asthma , Current Smoker and Patient abstained from smoking.   breath sounds clear to auscultation       Cardiovascular negative cardio ROS  Rhythm:Regular Rate:Normal     Neuro/Psych  Headaches    GI/Hepatic Neg liver ROS,,,S/p gastric bypass   Endo/Other  negative endocrine ROS    Renal/GU negative Renal ROS     Musculoskeletal   Abdominal   Peds  Hematology negative hematology ROS (+)   Anesthesia Other Findings   Reproductive/Obstetrics                             Anesthesia Physical Anesthesia Plan  ASA: 2  Anesthesia Plan: General   Post-op Pain Management: Tylenol PO (pre-op)* and Gabapentin PO (pre-op)*   Induction: Intravenous  PONV Risk Score and Plan: 2 and Dexamethasone, Ondansetron and Treatment may vary due to age or medical condition  Airway Management Planned: Oral ETT  Additional Equipment: None  Intra-op Plan:   Post-operative Plan: Extubation in OR  Informed Consent: I have reviewed the patients History and Physical, chart, labs and discussed the procedure including the risks, benefits and alternatives for the proposed anesthesia with the patient or authorized representative who has indicated his/her understanding and acceptance.     Dental advisory given  Plan Discussed with: CRNA  Anesthesia Plan Comments:        Anesthesia Quick Evaluation

## 2022-07-29 NOTE — Interval H&P Note (Signed)
Patient denies any changes. Proceed as planned with epigastric hernia repair.

## 2022-07-29 NOTE — Discharge Instructions (Addendum)
HERNIA REPAIR: POST OP INSTRUCTIONS   EAT Gradually transition to a high fiber diet with a fiber supplement over the next few weeks after discharge.  Start with a pureed / full liquid diet (see below)  WALK Walk an hour a day (cumulative- not all at once).  Control your pain to do that.    CONTROL PAIN Control pain so that you can walk, sleep, tolerate sneezing/coughing, and go up/down stairs.  HAVE A BOWEL MOVEMENT DAILY Keep your bowels regular to avoid problems.  OK to try a laxative to override constipation.  OK to use an antidiarrheal to slow down diarrhea.  Call if not better after 2 tries  CALL IF YOU HAVE PROBLEMS/CONCERNS Call if you are still struggling despite following these instructions. Call if you have concerns not answered by these instructions  ######################################################################    DIET: Follow a light bland diet & liquids the first 24 hours after arrival home, such as soup, liquids, starches, etc.  Be sure to drink plenty of fluids.  Quickly advance to a usual solid diet within a few days.  Avoid fast food or heavy meals initially as you are more likely to get nauseated or have irregular bowels.  A low-sugar, high-fiber diet for the rest of your life is ideal.   Take your usually prescribed home medications unless otherwise directed.  PAIN CONTROL: Pain is best controlled by a usual combination of three different methods TOGETHER: Ice/Heat Over the counter pain medication Prescription pain medication Most patients will experience some swelling and bruising around the hernia(s) such as the bellybutton, groins, or old incisions.  Ice packs or heating pads (30-60 minutes up to 6 times a day) will help. Use ice for the first few days to help decrease swelling and bruising, then switch to heat to help relax tight/sore spots and speed recovery.  Some people prefer to use ice alone, heat alone, alternating between ice & heat.  Experiment  to what works for you.  Swelling and bruising can take several weeks to resolve.   It is helpful to take an over-the-counter pain medication regularly for the first days: Acetaminophen (Tylenol, etc) 325-650mg  four times a day (every meal & bedtime) A  prescription for pain medication should be given to you upon discharge.  Take your pain medication as prescribed, IF NEEDED.  If you are having problems/concerns with the prescription medicine (does not control pain, nausea, vomiting, rash, itching, etc), please call us [phone] to see if we need to switch you to a different pain medicine that will work better for you and/or control your side effect better. If you need a refill on your pain medication, please contact your pharmacy.  They will contact our office to request authorization. Prescriptions will not be filled after 5 pm or on week-ends.  Avoid getting constipated.  Between the surgery and the pain medications, it is common to experience some constipation.  Increasing fluid intake and taking a fiber supplement (such as Metamucil, Citrucel, FiberCon, MiraLax, etc) 1-2 times a day regularly will usually help prevent this problem from occurring.  A mild laxative (prune juice, Milk of Magnesia, MiraLax, etc) should be taken according to package directions if there are no bowel movements after 48 hours.    Wash / shower every day, starting 2 days after surgery.  You may shower over the steri strips or skin glue which are waterproof.  No rubbing, scrubbing, lotions or ointments to incision(s). Do not soak or submerge.   Remove  your outer bandage 2 days after surgery. Steri strips (if present) will peel off after 1-2 weeks. Glue (if present) will flake off after about 2 weeks.  You may leave the incision open to air.  You may replace a dressing/Band-Aid to cover an incision for comfort if you wish.  Continue to shower over incision(s) after the dressing is off.  ACTIVITIES as tolerated:   You may  resume regular (light) daily activities beginning the next day--such as daily self-care, walking, climbing stairs--gradually increasing activities as tolerated.  Control your pain so that you can walk an hour a day.  If you can walk 30 minutes without difficulty, it is safe to try more intense activity such as jogging, treadmill, bicycling, low-impact aerobics, swimming, etc. Refrain from the most intensive and strenuous activity such as sit-ups, heavy lifting, contact sports, etc  Refrain from any heavy lifting or straining until 6 weeks after surgery.   DO NOT PUSH THROUGH PAIN.  Let pain be your guide: If it hurts to do something, don't do it.  Pain is your body warning you to avoid that activity for another week until the pain goes down. You may drive when you are no longer taking prescription pain medication, you can comfortably wear a seatbelt, and you can safely maneuver your car and apply brakes. You may have sexual intercourse when it is comfortable.   FOLLOW UP in our office Please call [facility] at [phone] to set up an appointment to see your surgeon in the office for a follow-up appointment approximately 2-3 weeks after your surgery. Make sure that you call for this appointment the day you arrive home to insure a convenient appointment time.  9.  If you have disability of FMLA / Family leave forms, please bring the forms to the office for processing.  (do not give to your surgeon).  WHEN TO CALL us [phone]: Poor pain control Reactions / problems with new medications (rash/itching, nausea, etc)  Fever over 101.5 F (38.5 C) Inability to urinate Nausea and/or vomiting Worsening swelling or bruising Continued bleeding from incision. Increased pain, redness, or drainage from the incision   The clinic staff is available to answer your questions during regular business hours (8:30am-5pm).  Please don't hesitate to call and ask to speak to one of our nurses for clinical concerns.    If you have a medical emergency, go to the nearest emergency room or call 911.  A surgeon from [facility] is always on call at the hospitals in [facility], [place] [address], [place], [place]  [address] ?  [address] [address], [place], [place]   [address] MAIN: [id number] ? TOLL FREE: [phone] ? FAX: [phone] [email or web address]   Post Anesthesia Home Care Instructions  Activity: Get plenty of rest for the remainder of the day. A responsible individual must stay with you for 24 hours following the procedure.  For the next 24 hours, DO NOT: -Drive a car -Advertising copywriter -Drink alcoholic beverages -Take any medication unless instructed by your physician -Make any legal decisions or sign important papers.  Meals: Start with liquid foods such as gelatin or soup. Progress to regular foods as tolerated. Avoid greasy, spicy, heavy foods. If nausea and/or vomiting occur, drink only clear liquids until the nausea and/or vomiting subsides. Call your physician if vomiting continues.  Special Instructions/Symptoms: Your throat may feel dry or sore from the anesthesia or the breathing tube placed in your throat  during surgery. If this causes discomfort, gargle with warm salt water. The discomfort should disappear within 24 hours.  If you had a scopolamine patch placed behind your ear for the management of post- operative nausea and/or vomiting:  1. The medication in the patch is effective for 72 hours, after which it should be removed.  Wrap patch in a tissue and discard in the trash. Wash hands thoroughly with soap and water. 2. You may remove the patch earlier than 72 hours if you experience unpleasant side effects which may include dry mouth, dizziness or visual disturbances. 3. Avoid touching the patch. Wash your hands with soap and water after contact with the patch.

## 2022-07-29 NOTE — Anesthesia Procedure Notes (Signed)
Procedure Name: Intubation Date/Time: 07/29/2022 3:22 PM  Performed by: Francie Massing, CRNAPre-anesthesia Checklist: Patient identified, Emergency Drugs available, Suction available and Patient being monitored Patient Re-evaluated:Patient Re-evaluated prior to induction Oxygen Delivery Method: Circle system utilized Preoxygenation: Pre-oxygenation with 100% oxygen Induction Type: IV induction Ventilation: Mask ventilation without difficulty Laryngoscope Size: Mac and 3 Grade View: Grade I Tube type: Oral Tube size: 7.0 mm Number of attempts: 1 Airway Equipment and Method: Stylet and Oral airway Placement Confirmation: ETT inserted through vocal cords under direct vision, positive ETCO2 and breath sounds checked- equal and bilateral Secured at: 21 cm Tube secured with: Tape Dental Injury: Teeth and Oropharynx as per pre-operative assessment

## 2022-07-30 ENCOUNTER — Encounter (HOSPITAL_BASED_OUTPATIENT_CLINIC_OR_DEPARTMENT_OTHER): Payer: Self-pay | Admitting: Surgery

## 2022-08-12 DIAGNOSIS — Z1231 Encounter for screening mammogram for malignant neoplasm of breast: Secondary | ICD-10-CM | POA: Diagnosis not present

## 2022-08-20 DIAGNOSIS — H2513 Age-related nuclear cataract, bilateral: Secondary | ICD-10-CM | POA: Diagnosis not present

## 2022-08-26 DIAGNOSIS — Z09 Encounter for follow-up examination after completed treatment for conditions other than malignant neoplasm: Secondary | ICD-10-CM | POA: Diagnosis not present

## 2022-09-03 DIAGNOSIS — Q111 Other anophthalmos: Secondary | ICD-10-CM | POA: Diagnosis not present

## 2022-09-03 DIAGNOSIS — H2511 Age-related nuclear cataract, right eye: Secondary | ICD-10-CM | POA: Diagnosis not present

## 2022-09-03 DIAGNOSIS — H402213 Chronic angle-closure glaucoma, right eye, severe stage: Secondary | ICD-10-CM | POA: Diagnosis not present

## 2022-09-04 DIAGNOSIS — Q112 Microphthalmos: Secondary | ICD-10-CM | POA: Diagnosis not present

## 2022-09-04 DIAGNOSIS — Z961 Presence of intraocular lens: Secondary | ICD-10-CM | POA: Diagnosis not present

## 2022-09-12 DIAGNOSIS — H402233 Chronic angle-closure glaucoma, bilateral, severe stage: Secondary | ICD-10-CM | POA: Diagnosis not present

## 2022-09-12 DIAGNOSIS — H04123 Dry eye syndrome of bilateral lacrimal glands: Secondary | ICD-10-CM | POA: Diagnosis not present

## 2022-09-12 DIAGNOSIS — Z961 Presence of intraocular lens: Secondary | ICD-10-CM | POA: Diagnosis not present

## 2022-09-12 DIAGNOSIS — H47323 Drusen of optic disc, bilateral: Secondary | ICD-10-CM | POA: Diagnosis not present

## 2022-09-12 DIAGNOSIS — Q112 Microphthalmos: Secondary | ICD-10-CM | POA: Diagnosis not present

## 2022-10-03 DIAGNOSIS — H402223 Chronic angle-closure glaucoma, left eye, severe stage: Secondary | ICD-10-CM | POA: Diagnosis not present

## 2022-10-03 DIAGNOSIS — H04123 Dry eye syndrome of bilateral lacrimal glands: Secondary | ICD-10-CM | POA: Diagnosis not present

## 2022-10-03 DIAGNOSIS — Q112 Microphthalmos: Secondary | ICD-10-CM | POA: Diagnosis not present

## 2022-10-03 DIAGNOSIS — H2512 Age-related nuclear cataract, left eye: Secondary | ICD-10-CM | POA: Diagnosis not present

## 2022-10-03 DIAGNOSIS — H47323 Drusen of optic disc, bilateral: Secondary | ICD-10-CM | POA: Diagnosis not present

## 2022-10-03 DIAGNOSIS — Z961 Presence of intraocular lens: Secondary | ICD-10-CM | POA: Diagnosis not present

## 2022-10-03 DIAGNOSIS — H402233 Chronic angle-closure glaucoma, bilateral, severe stage: Secondary | ICD-10-CM | POA: Diagnosis not present

## 2022-11-14 DIAGNOSIS — Z961 Presence of intraocular lens: Secondary | ICD-10-CM | POA: Diagnosis not present

## 2022-11-14 DIAGNOSIS — Q112 Microphthalmos: Secondary | ICD-10-CM | POA: Diagnosis not present

## 2022-11-14 DIAGNOSIS — H47323 Drusen of optic disc, bilateral: Secondary | ICD-10-CM | POA: Diagnosis not present

## 2022-11-14 DIAGNOSIS — H402233 Chronic angle-closure glaucoma, bilateral, severe stage: Secondary | ICD-10-CM | POA: Diagnosis not present

## 2023-07-22 DIAGNOSIS — D509 Iron deficiency anemia, unspecified: Secondary | ICD-10-CM | POA: Diagnosis not present

## 2023-07-22 DIAGNOSIS — H401133 Primary open-angle glaucoma, bilateral, severe stage: Secondary | ICD-10-CM | POA: Diagnosis not present

## 2023-07-22 DIAGNOSIS — Z13220 Encounter for screening for lipoid disorders: Secondary | ICD-10-CM | POA: Diagnosis not present

## 2023-07-22 DIAGNOSIS — Z0000 Encounter for general adult medical examination without abnormal findings: Secondary | ICD-10-CM | POA: Diagnosis not present

## 2023-07-22 DIAGNOSIS — Z136 Encounter for screening for cardiovascular disorders: Secondary | ICD-10-CM | POA: Diagnosis not present

## 2023-07-22 DIAGNOSIS — M1990 Unspecified osteoarthritis, unspecified site: Secondary | ICD-10-CM | POA: Diagnosis not present

## 2023-07-22 DIAGNOSIS — R7303 Prediabetes: Secondary | ICD-10-CM | POA: Diagnosis not present

## 2023-07-24 ENCOUNTER — Encounter: Payer: Self-pay | Admitting: Emergency Medicine

## 2023-08-12 DIAGNOSIS — M79672 Pain in left foot: Secondary | ICD-10-CM | POA: Diagnosis not present

## 2023-08-12 DIAGNOSIS — M7661 Achilles tendinitis, right leg: Secondary | ICD-10-CM | POA: Diagnosis not present

## 2023-08-12 DIAGNOSIS — M7662 Achilles tendinitis, left leg: Secondary | ICD-10-CM | POA: Diagnosis not present

## 2023-08-12 DIAGNOSIS — M79671 Pain in right foot: Secondary | ICD-10-CM | POA: Diagnosis not present

## 2023-08-18 DIAGNOSIS — Z1231 Encounter for screening mammogram for malignant neoplasm of breast: Secondary | ICD-10-CM | POA: Diagnosis not present

## 2023-09-09 ENCOUNTER — Ambulatory Visit (INDEPENDENT_AMBULATORY_CARE_PROVIDER_SITE_OTHER): Admitting: Podiatry

## 2023-09-09 ENCOUNTER — Encounter: Payer: Self-pay | Admitting: Podiatry

## 2023-09-09 ENCOUNTER — Ambulatory Visit (INDEPENDENT_AMBULATORY_CARE_PROVIDER_SITE_OTHER)

## 2023-09-09 DIAGNOSIS — M7661 Achilles tendinitis, right leg: Secondary | ICD-10-CM

## 2023-09-09 DIAGNOSIS — M722 Plantar fascial fibromatosis: Secondary | ICD-10-CM

## 2023-09-09 DIAGNOSIS — M7662 Achilles tendinitis, left leg: Secondary | ICD-10-CM

## 2023-09-09 NOTE — Patient Instructions (Signed)

## 2023-09-09 NOTE — Progress Notes (Signed)
  Subjective:  Patient ID: Melinda Elliott, female    DOB: [birth year],   MRN: [id number]  Chief Complaint  Patient presents with   Foot Pain    I'm having pain in my achilles and heels.  They've been swelling, burning, aching in pain.  I can not stand long periods of time.    53 y.o. female presents for concern of bilateral heel pain that has been ongoing for several years. She has been dealing with this since 2017. She has had achilles tendonitis and tried stretching and PT and anti-inflammatoires. She has had surgery on right foot for haglunds resection and relates continues to have pain in the back and bottom of heels. She does relate some occasional tingling. Has been taking tumeric which helps but has to take more now. . Denies any other pedal complaints. Denies n/v/f/c.   Past Medical History:  Diagnosis Date   Anxiety    Asthma    Cataracts, both eyes    Depression    Glaucoma    Heart murmur    mild no cardiology   Migraines    Schizoaffective disorder (HCC)    Wears dentures full set    Wears glasses     Objective:  Physical Exam: Vascular: DP/PT pulses 2/4 bilateral. CFT <3 seconds. Normal hair growth on digits. No edema.  Skin. No lacerations or abrasions bilateral feet.  Musculoskeletal: MMT 5/5 bilateral lower extremities in DF, PF, Inversion and Eversion. Deceased ROM in DF of ankle joint. Tender to the medial calcaneal tubercle bilaterally . Pain along achilles insertion and proximally along the tendon. On right palpable bulging of tendon near insertion and edema noted. No pain PT or arch. No pain with calcaneal squeeze.  Neurological: Sensation intact to light touch.   Assessment:   1. Tendonitis, Achilles, right   2. Plantar fasciitis   3. Tendonitis, Achilles, left      Plan:  Patient was evaluated and treated and all questions answered. -Xrays reviewed. No acute fractures or dislocations noted. Spurring noted to posterior calcaneus on left. Resection of  haglunds on right with residual drill holes noted from anchor insertions.  -Discussed Achilles insertional tendonitis and treatment options with patient.  -Discussed stretching exercises. Continue stretching and will start PT. Amb ref to PT.  -Discussed meloxicam and injections for PF but patient would like to continue with tumeric for now.  -Heel lifts provided and discussed proper shoewear. Discusssed CMO.  -Discussed if no improvement will consider MRI/PT/EPAT/PRP injections.  -Patient to return to office in 6 weeks for recheck.    Asberry Failing, DPM

## 2023-09-21 ENCOUNTER — Ambulatory Visit: Admitting: Physical Therapy

## 2023-09-23 ENCOUNTER — Encounter: Payer: Self-pay | Admitting: Physical Therapy

## 2023-09-23 ENCOUNTER — Other Ambulatory Visit: Payer: Self-pay

## 2023-09-23 ENCOUNTER — Ambulatory Visit: Attending: Podiatry | Admitting: Physical Therapy

## 2023-09-23 DIAGNOSIS — R2689 Other abnormalities of gait and mobility: Secondary | ICD-10-CM | POA: Insufficient documentation

## 2023-09-23 DIAGNOSIS — M7661 Achilles tendinitis, right leg: Secondary | ICD-10-CM | POA: Diagnosis not present

## 2023-09-23 DIAGNOSIS — M722 Plantar fascial fibromatosis: Secondary | ICD-10-CM | POA: Insufficient documentation

## 2023-09-23 DIAGNOSIS — M7662 Achilles tendinitis, left leg: Secondary | ICD-10-CM | POA: Insufficient documentation

## 2023-09-23 DIAGNOSIS — M25572 Pain in left ankle and joints of left foot: Secondary | ICD-10-CM | POA: Diagnosis not present

## 2023-09-23 DIAGNOSIS — M25571 Pain in right ankle and joints of right foot: Secondary | ICD-10-CM | POA: Diagnosis not present

## 2023-09-23 DIAGNOSIS — M6281 Muscle weakness (generalized): Secondary | ICD-10-CM | POA: Diagnosis not present

## 2023-09-23 NOTE — Therapy (Addendum)
 OUTPATIENT PHYSICAL THERAPY LOWER EXTREMITY EVALUATION   Melinda Elliott Name: Melinda Elliott MRN: [id number] DOB:[birth year], 53 y.o., female Today's Date: 09/23/2023  END OF SESSION:  Melinda Elliott End of Session - 09/23/23 1311     Visit Number 1    Number of Visits 12    Date for Melinda Elliott Re-Evaluation 11/04/23    Authorization Type [employer] Medicare    Authorization Time Period No auth required    Progress Note Due on Visit 10    Melinda Elliott Start Time 1315    Melinda Elliott Stop Time 1405    Melinda Elliott Time Calculation (min) 50 min    Activity Tolerance Melinda Elliott tolerated treatment well    Behavior During Therapy WFL for tasks assessed/performed          Past Medical History:  Diagnosis Date   Anxiety    Asthma    Cataracts, both eyes    Depression    Glaucoma    Heart murmur    mild no cardiology   Migraines    Schizoaffective disorder (HCC)    Wears dentures full set    Wears glasses    Past Surgical History:  Procedure Laterality Date   ABDOMINAL SURGERY     CESAREAN SECTION     EPIGASTRIC HERNIA REPAIR N/A 07/29/2022   Procedure: HERNIA REPAIR EPIGASTRIC ADULT;  Surgeon: Signe Mitzie LABOR, MD;  Location: [facility];  Service: General;  Laterality: N/A;   ESOPHAGOGASTRODUODENOSCOPY (EGD) WITH PROPOFOL  N/A 07/02/2022   Procedure: ESOPHAGOGASTRODUODENOSCOPY (EGD) WITH PROPOFOL ;  Surgeon: Dianna Specking, MD;  Location: [facility];  Service: Gastroenterology;  Laterality: N/A;   GASTRIC BYPASS  03/01/2003   at [facility]   HERNIA REPAIR     HYSTEROSCOPY WITH D & C N/A 08/02/2019   Procedure: DILATATION AND CURETTAGE /HYSTEROSCOPY;  Surgeon: Marilynn Nest, DO;  Location: [facility];  Service: Gynecology;  Laterality: N/A;   LASIK     right ankle surgery achilles tendon repair  08/11/2021   surgery for twisting of intestines 2008 and 2011     Melinda Elliott Active Problem List   Diagnosis Date Noted   Midepigastric pain 06/30/2022    PCP: Vernon Velna SAUNDERS, MD  REFERRING PROVIDER: Joya Stabs, DPM  REFERRING DIAG: M72.2 (ICD-10-CM) - Plantar fasciitis M76.61 (ICD-10-CM) - Tendonitis, Achilles, right M76.62 (ICD-10-CM) - Tendonitis, Achilles, left  THERAPY DIAG:  Pain in left ankle and joints of left foot - Plan: Melinda Elliott plan of care cert/re-cert  Pain in right ankle and joints of right foot - Plan: Melinda Elliott plan of care cert/re-cert  Muscle weakness (generalized) - Plan: Melinda Elliott plan of care cert/re-cert  Other abnormalities of gait and mobility - Plan: Melinda Elliott plan of care cert/re-cert  Rationale for Evaluation and Treatment: Rehabilitation  ONSET DATE: 2017  SUBJECTIVE:   SUBJECTIVE STATEMENT: Melinda Elliott states it started in 2017. Melinda Elliott used to stand and walk a lot. Started to get a lot of pressure in the bottom of the heels and went from there to the back of the heels/tendon. Sometimes on the L foot it gets numb on 4th toe. Melinda Elliott reports she is unable to stand for very long. Currently even aches at rest. Used to go to the gym and work out. Melinda Elliott had R Achilles heel surgery in July 2023 with plans for L Achilles surgery 6 months later to address the calcium build up on her posterior heels. Pain was improved at the time but now it's hurting the same as before the surgery. Tried to get  back to normal routine of walking (loves to walk) ~2 months ago. Melinda Elliott will see podiatry for orthotics and get better shoe. Does note some increased L knee aching from compensatory motions.   PERTINENT HISTORY: N/a  PAIN:  Are you having pain? Yes: NPRS scale: 4 or 5 currently sitting at rest;  Pain location: Bilat heels plantar surface and posterior Pain description: Aching Aggravating factors: Standing (15 min), walking (15 min) Relieving factors: Turmeric  PRECAUTIONS: None  RED FLAGS: None   WEIGHT BEARING RESTRICTIONS: No  FALLS:  Has Melinda Elliott fallen in last 6 months? No  LIVING ENVIRONMENT: Lives with: lives alone Lives in: House/apartment Stairs: 1 step to enter Has following equipment at home:  None  OCCUPATION: Not working currently  PLOF: Likes to play with her grandchildren and be active  Melinda Elliott GOALS: Return to her normal activities with less pain  NEXT MD VISIT: PRN  OBJECTIVE:  Note: Objective measures were completed at Evaluation unless otherwise noted.  DIAGNOSTIC FINDINGS: 09/09/23 bilat foot/ankle x-rays impression in office note states Xrays reviewed. No acute fractures or dislocations noted. Spurring noted to posterior calcaneus on left. Resection of haglunds on right with residual drill holes noted from anchor insertions.   Melinda Elliott SURVEYS:  PSFS: THE Melinda Elliott SPECIFIC FUNCTIONAL SCALE  Place score of 0-10 (0 = unable to perform activity and 10 = able to perform activity at the same level as before injury or problem)  Activity Date: 09/23/23    Standing 5    2.  Walking 5    3.       4.      Total Score 5      Total Score = Sum of activity scores/number of activities  Minimally Detectable Change: 3 points (for single activity); 2 points (for average score)  [facility] [employer] (nd). The Melinda Elliott Specific Functional Scale . Retrieved from [email or web address]     Extreme difficulty/unable (0), Quite a bit of difficulty (1), Moderate difficulty (2), Little difficulty (3), No difficulty (4) Survey date:  09/23/23  Any of your usual work, housework or school activities 2  2. Usual hobbies, recreational or sporting activities 2  3. Getting into/out of the bath 2  4. Walking between rooms 2  5. Putting on socks/shoes 4  6. Squatting  2  7. Lifting an object, like a bag of groceries from the floor 4  8. Performing light activities around your home 4  9. Performing heavy activities around your home 3  10. Getting into/out of a car 1  11. Walking 2 blocks 1  12. Walking 1 mile 1  13. Going up/down 10 stairs (1 flight) 3  14. Standing for 1 hour 0  15.  sitting for 1 hour 1  16. Running on even  ground 0  17. Running on uneven ground 0  18. Making sharp turns while running fast 2  19. Hopping  4  20. Rolling over in bed 4  Score total:  42     COGNITION: Overall cognitive status: Within functional limits for tasks assessed     SENSATION: WFL  EDEMA:  Can get edematous  MUSCLE LENGTH: Did not assess  POSTURE: Slight knee valgus with standing, feet pronated  PALPATION: R>L TTP of post tib; flexor digitorum, quadratus plantae insertions to calcaneus; and soleus Hypomobile calcaneo/navicular joint in R foot as well as talocrural joint  LOWER EXTREMITY ROM:  Active ROM Right eval Left eval  Hip flexion    Hip extension  Hip abduction    Hip adduction    Hip internal rotation    Hip external rotation    Knee flexion    Knee extension    Ankle dorsiflexion 8 5  Ankle plantarflexion 40 55  Ankle inversion 25 20  Ankle eversion 13 17   (Blank rows = not tested)  LOWER EXTREMITY MMT:  MMT Right eval Left eval  Hip flexion 5 4+  Hip extension TBA TBA  Hip abduction TBA TBA  Hip adduction    Hip internal rotation    Hip external rotation    Knee flexion 4+ 4  Knee extension 5 5  Ankle dorsiflexion 3+ 4+  Ankle plantarflexion    Ankle inversion 4- 4  Ankle eversion 5 4   (Blank rows = not tested)  LOWER EXTREMITY SPECIAL TESTS:  Did not assess  FUNCTIONAL TESTS:  SLS TBA  GAIT: Distance walked: Into clinic Assistive device utilized: None Level of assistance: Complete Independence Comments: Antalgic gait pattern, R hip and knee slightly flexed but demos diminished heel strike and toe off bilaterally. Bilat foot pronation noted with stance phase (L worse than R)                                                                                                                                TREATMENT DATE: 09/23/23 Neuromuscular re-ed: Trial of K-tape for Achilles/arch and another I strip for post tib facilitation and pain  inhibition Therapeutic exercise: See HEP below  Self Care: See educatoin below   Melinda Elliott EDUCATION:  Education details: Frozen water bottle to massage arches, massaging post tib, k-taping, initial HEP, Melinda Elliott exam findings and POC Person educated: Melinda Elliott Education method: Explanation, Demonstration, and Handouts Education comprehension: verbalized understanding, returned demonstration, and needs further education  HOME EXERCISE PROGRAM: Access Code: [email or web address] URL: [email or web address] Date: 09/23/2023 Prepared by: Machelle Raybon April Marie Shi Blankenship  Exercises - Foot Roller Plantar Massage  - 1 x daily - 7 x weekly - 2 sets - 30 sec hold - Seated Ankle Inversion Eversion PROM  - 2-3 x daily - 7 x weekly - 2 sets - 30 sec hold - Seated Ankle Circles  - 2-3 x daily - 7 x weekly - 2 sets - 10 reps - Arch Lifting  - 1 x daily - 7 x weekly - 2 sets - 10 reps - Standing Bilateral Soleus Stretch at Counter  - 1 x daily - 7 x weekly - 2 sets - 30 sec hold - Standing Bilateral Gastrocnemius Stretch at Counter  - 1 x daily - 7 x weekly - 2 sets - 30 sec hold  ASSESSMENT:  CLINICAL IMPRESSION: Melinda Elliott is a 53 y.o. F who was seen today for physical therapy evaluation and treatment for bilat heel pain. PMH significant for R heel surgery to remove calcium build up. Assessment demos R>L foot/ankle medial/lateral hypomobility, R>L posterior tibialis tenderness/pain (especially in insertion along medial arch), bilat foot pronation  with standing/weight bearing, and bilat foot/ankle weakness (R worse than L) affecting Melinda Elliott's tolerance to standing and walking activities. Melinda Elliott will benefit from Melinda Elliott to improve on these issues to maximize her level of function. Performed trial of ktape to better support Melinda Elliott's post tib/arch and provided Melinda Elliott HEP to start working on gentle mobility at home.   OBJECTIVE IMPAIRMENTS: Abnormal gait, decreased activity tolerance, decreased balance, decreased endurance, decreased mobility,  difficulty walking, decreased ROM, decreased strength, hypomobility, increased fascial restrictions, increased muscle spasms, improper body mechanics, postural dysfunction, and pain.   ACTIVITY LIMITATIONS: lifting, standing, squatting, stairs, transfers, and locomotion level  PARTICIPATION LIMITATIONS: meal prep, cleaning, laundry, shopping, community activity, and yard work  PERSONAL FACTORS: Age, Fitness, Past/current experiences, and Time since onset of injury/illness/exacerbation are also affecting Melinda Elliott's functional outcome.   REHAB POTENTIAL: Good  CLINICAL DECISION MAKING: Evolving/moderate complexity  EVALUATION COMPLEXITY: Moderate   GOALS: Goals reviewed with Melinda Elliott? Yes  SHORT TERM GOALS: Target date: 10/21/2023  Melinda Elliott will be ind with initial HEP Baseline: Goal status: INITIAL  2.  Melinda Elliott will report reduction of pain by >/=25% Baseline:  Goal status: INITIAL  3.  Melinda Elliott will demo improved ankle eversion to at least 20 deg for increased ROM Baseline:  Goal status: INITIAL   LONG TERM GOALS: Target date: 11/18/2023   Melinda Elliott will be ind with management and progression of HEP Baseline:  Goal status: INITIAL  2.  Melinda Elliott will have improved PSFS average score to >/=7 to demo MCID Baseline:  Goal status: INITIAL  3.  Melinda Elliott will have improved LEFS score to >/=54 to demo MCID Baseline: 42 Goal status: INITIAL  4.  Melinda Elliott will be able to tolerate SLS for at least 30 sec to demo increased LE stability Baseline:  Goal status: INITIAL  5.  Melinda Elliott will report improved overall pain by >/=50% Baseline:  Goal status: INITIAL  6.  Melinda Elliott will be able to walk with normal reciprocal pattern x 200' for household ambulation  Baseline:  Goal status: INITIAL   PLAN:  Melinda Elliott FREQUENCY: 2x/week  Melinda Elliott DURATION: 8 weeks  PLANNED INTERVENTIONS: 97164- Melinda Elliott Re-evaluation, 97750- Physical Performance Testing, 97110-Therapeutic exercises, 97530- Therapeutic activity, [id number]- Neuromuscular re-education, 97535-  Self Care, 02859- Manual therapy, [id number]- Gait training, [phone]- Aquatic Therapy, [phone]- Electrical stimulation (unattended), 97016- Vasopneumatic device, [id number]- Ultrasound, [id number]- Ionotophoresis 4mg /ml Dexamethasone , 79439 (1-2 muscles), 20561 (3+ muscles)- Dry Needling, Melinda Elliott/Family education, Balance training, Stair training, Taping, Joint mobilization, Spinal mobilization, Cryotherapy, and Moist heat  PLAN FOR NEXT SESSION: Assess response to HEP. How was taping? Continue ankle mobility/strengthening. Focus on arch/posterior tib eccentrics/strengthening. Check SLS. Work on Animator.    Asheton Scheffler April Ma L Merna Baldi, [place], DPT 09/23/2023, 3:33 PM  Date of referral: 09/09/23 Referring provider: Joya Stabs, DPM  Referring diagnosis?  M72.2 (ICD-10-CM) - Plantar fasciitis  M76.61 (ICD-10-CM) - Tendonitis, Achilles, right  M76.62 (ICD-10-CM) - Tendonitis, Achilles, left   Treatment diagnosis? (if different than referring diagnosis) Pain in left ankle and joints of left foot [M25.572]  Pain in right ankle and joints of right foot [M25.571]   What was this (referring dx) caused by? Ongoing Issue and Arthritis  Lysle of Condition: Chronic (continuous duration > 3 months)   Laterality: Both  Current Functional Measure Score: LEFS 42  Objective measurements identify impairments when they are compared to normal values, the uninvolved extremity, and prior level of function.  []  Yes  []  No  Objective assessment of functional ability: Moderate functional limitations   Briefly describe  symptoms: bilat posterior and plantar heel aching pain  How did symptoms start: Years ago with prolonged standing and walking  Average pain intensity:  Last 24 hours: 4-5  Past week: 4-5  How often does the Melinda Elliott experience symptoms? Constantly  How much have the symptoms interfered with usual daily activities? Moderately  How has condition changed since care began at this facility? NA - initial  visit  In general, how is the patients overall health? Very Good   BACK PAIN (STarT Back Screening Tool) No

## 2023-09-28 ENCOUNTER — Ambulatory Visit

## 2023-09-28 DIAGNOSIS — M7662 Achilles tendinitis, left leg: Secondary | ICD-10-CM

## 2023-09-28 DIAGNOSIS — M7661 Achilles tendinitis, right leg: Secondary | ICD-10-CM

## 2023-09-28 DIAGNOSIS — M722 Plantar fascial fibromatosis: Secondary | ICD-10-CM

## 2023-09-28 NOTE — Progress Notes (Signed)
 [id number] Notification or Prior Authorization is not required for the requested services You are not required to submit a notification/prior authorization based on the information provided. If you have general questions about the prior authorization requirements, visit [email or web address] > Clinician Resources > Advance and Admission Notification Requirements. The number above acknowledges your notification. Please write this reference number down for future reference. If you would like to request an organization determination, please call us  at [phone]. Decision ID #: [id number] The number above acknowledges your inquiry and our response. Please write this number down and refer to it for future inquiries. Coverage and payment for an item or service is governed by the member's benefit plan document, and, if applicable, the provider's participation agreement with the Health Plan.  [id number] Womens orthopedic shoes  Notification or Prior Authorization is not required for the requested services This [employer]vant[employer] members plan does not currently require a prior authorization for these services as they may not be eligible for coverage under the members benefit plan. If you have general questions about the prior authorization requirements or services covered under the members plan, please visit [email or web address]. If you still wish to submit your request for review, please select the Continue with Submission button below. Decision ID #: [id number] The number above acknowledges your inquiry and our response. Please write this number down and refer to it for future inquiries. Coverage and payment for an item or service is governed by the member's benefit plan document, and, if applicable, the provider's participation agreement with the Health Plan    Orthopedic shoes and custom foot orthotics patient will return when in for fitting   Orthotics   Patient was present  and evaluated for Custom molded foot orthotics. Patient will benefit from CFO's to provide total contact to BIL MLA's helping to balance and distribute body weight more evenly across BIL feet helping to reduce plantar pressure and pain. Orthotic will also encourage FF / RF alignment  Patient was scanned today and will return for fitting upon receipt  Orthopedic shoes will provide extra depth and width needed to accommodate foot and orthotics   Lolita Schultze CPed, CFo, CFm

## 2023-10-01 ENCOUNTER — Ambulatory Visit: Admitting: Physical Therapy

## 2023-10-01 ENCOUNTER — Telehealth: Payer: Self-pay | Admitting: Physical Therapy

## 2023-10-01 NOTE — Telephone Encounter (Signed)
Called and informed patient of missed visit and provided reminder of next appt and attendance policy.  

## 2023-10-06 ENCOUNTER — Ambulatory Visit: Admitting: Physical Therapy

## 2023-10-06 ENCOUNTER — Encounter: Payer: Self-pay | Admitting: Physical Therapy

## 2023-10-06 DIAGNOSIS — R2689 Other abnormalities of gait and mobility: Secondary | ICD-10-CM

## 2023-10-06 DIAGNOSIS — M6281 Muscle weakness (generalized): Secondary | ICD-10-CM

## 2023-10-06 DIAGNOSIS — M25572 Pain in left ankle and joints of left foot: Secondary | ICD-10-CM

## 2023-10-06 DIAGNOSIS — M7661 Achilles tendinitis, right leg: Secondary | ICD-10-CM | POA: Diagnosis not present

## 2023-10-06 DIAGNOSIS — M722 Plantar fascial fibromatosis: Secondary | ICD-10-CM | POA: Diagnosis not present

## 2023-10-06 DIAGNOSIS — M7662 Achilles tendinitis, left leg: Secondary | ICD-10-CM | POA: Diagnosis not present

## 2023-10-06 DIAGNOSIS — M25571 Pain in right ankle and joints of right foot: Secondary | ICD-10-CM | POA: Diagnosis not present

## 2023-10-06 NOTE — Therapy (Signed)
 OUTPATIENT PHYSICAL THERAPY TREATMENT   Patient Name: Melinda Elliott MRN: [id number] DOB:1970/07/01, 53 y.o., female Today's Date: 10/06/2023  END OF SESSION:  PT End of Session - 10/06/23 0802     Visit Number 2    Number of Visits 12    Date for PT Re-Evaluation 11/04/23    Authorization Type [employer] Medicare    Authorization Time Period No auth required    PT Start Time 0803    Activity Tolerance Patient tolerated treatment well    Behavior During Therapy [facility] [place] for tasks assessed/performed          Past Medical History:  Diagnosis Date   Anxiety    Asthma    Cataracts, both eyes    Depression    Glaucoma    Heart murmur    mild no cardiology   Migraines    Schizoaffective disorder (HCC)    Wears dentures full set    Wears glasses    Past Surgical History:  Procedure Laterality Date   ABDOMINAL SURGERY     CESAREAN SECTION     EPIGASTRIC HERNIA REPAIR N/A 07/29/2022   Procedure: HERNIA REPAIR EPIGASTRIC ADULT;  Surgeon: Signe Mitzie LABOR, MD;  Location: [facility];  Service: General;  Laterality: N/A;   ESOPHAGOGASTRODUODENOSCOPY (EGD) WITH PROPOFOL  N/A 07/02/2022   Procedure: ESOPHAGOGASTRODUODENOSCOPY (EGD) WITH PROPOFOL ;  Surgeon: Dianna Specking, MD;  Location: [facility];  Service: Gastroenterology;  Laterality: N/A;   GASTRIC BYPASS  03/01/2003   at [facility]   HERNIA REPAIR     HYSTEROSCOPY WITH D & C N/A 08/02/2019   Procedure: DILATATION AND CURETTAGE /HYSTEROSCOPY;  Surgeon: Marilynn Nest, DO;  Location: [facility];  Service: Gynecology;  Laterality: N/A;   LASIK     right ankle surgery achilles tendon repair  08/11/2021   surgery for twisting of intestines 2008 and 2011     Patient Active Problem List   Diagnosis Date Noted   Midepigastric pain 06/30/2022    PCP: Vernon Velna SAUNDERS, MD  REFERRING PROVIDER: Joya Stabs, DPM  REFERRING DIAG: M72.2 (ICD-10-CM) - Plantar fasciitis M76.61 (ICD-10-CM) - Tendonitis,  Achilles, right M76.62 (ICD-10-CM) - Tendonitis, Achilles, left  THERAPY DIAG:  No diagnosis found.  Rationale for Evaluation and Treatment: Rehabilitation  ONSET DATE: 2017  SUBJECTIVE:   SUBJECTIVE STATEMENT: 10/06/2023 Pt states she got custom fitted for her orthotics. Will receive them by the end of this week or next week. Has been consistent with her HEP but realized she has not been holding her stretches long enough. Pt states taping did help.   From eval:  Pt states it started in 2017. Pt used to stand and walk a lot. Started to get a lot of pressure in the bottom of the heels and went from there to the back of the heels/tendon. Sometimes on the L foot it gets numb on 4th toe. Pt reports she is unable to stand for very long. Currently even aches at rest. Used to go to the gym and work out. Pt had R Achilles heel surgery in July 2023 with plans for L Achilles surgery 6 months later to address the calcium build up on her posterior heels. Pain was improved at the time but now it's hurting the same as before the surgery. Tried to get back to normal routine of walking (loves to walk) ~2 months ago. Pt will see podiatry for orthotics and get better shoe. Does note some increased L knee aching from compensatory motions.  PERTINENT HISTORY: N/a  PAIN:  Are you having pain? Yes: NPRS scale: 4 or 5 currently sitting at rest;  Pain location: Bilat heels plantar surface and posterior Pain description: Aching Aggravating factors: Standing (15 min), walking (15 min) Relieving factors: Turmeric  PRECAUTIONS: None  RED FLAGS: None   WEIGHT BEARING RESTRICTIONS: No  FALLS:  Has patient fallen in last 6 months? No  LIVING ENVIRONMENT: Lives with: lives alone Lives in: House/apartment Stairs: 1 step to enter Has following equipment at home: None  OCCUPATION: Not working currently  PLOF: Likes to play with her grandchildren and be active  PATIENT GOALS: Return to her normal  activities with less pain  NEXT MD VISIT: PRN  OBJECTIVE:  Note: Objective measures were completed at Evaluation unless otherwise noted.  DIAGNOSTIC FINDINGS: 09/09/23 bilat foot/ankle x-rays impression in office note states Xrays reviewed. No acute fractures or dislocations noted. Spurring noted to posterior calcaneus on left. Resection of haglunds on right with residual drill holes noted from anchor insertions.   PATIENT SURVEYS:  PSFS: THE PATIENT SPECIFIC FUNCTIONAL SCALE  Place score of 0-10 (0 = unable to perform activity and 10 = able to perform activity at the same level as before injury or problem)  Activity Date: 09/23/23    Standing 5    2.  Walking 5    3.       4.      Total Score 5      Total Score = Sum of activity scores/number of activities  Minimally Detectable Change: 3 points (for single activity); 2 points (for average score)  [facility] (nd). The Patient Specific Functional Scale . Retrieved from [email or web address]     Extreme difficulty/unable (0), Quite a bit of difficulty (1), Moderate difficulty (2), Little difficulty (3), No difficulty (4) Survey date:  09/23/23  Any of your usual work, housework or school activities 2  2. Usual hobbies, recreational or sporting activities 2  3. Getting into/out of the bath 2  4. Walking between rooms 2  5. Putting on socks/shoes 4  6. Squatting  2  7. Lifting an object, like a bag of groceries from the floor 4  8. Performing light activities around your home 4  9. Performing heavy activities around your home 3  10. Getting into/out of a car 1  11. Walking 2 blocks 1  12. Walking 1 mile 1  13. Going up/down 10 stairs (1 flight) 3  14. Standing for 1 hour 0  15.  sitting for 1 hour 1  16. Running on even ground 0  17. Running on uneven ground 0  18. Making sharp turns while running fast 2  19. Hopping  4  20. Rolling over in bed 4  Score  total:  42     COGNITION: Overall cognitive status: Within functional limits for tasks assessed     SENSATION: WFL  EDEMA:  Can get edematous  MUSCLE LENGTH: Did not assess  POSTURE: Slight knee valgus with standing, feet pronated  PALPATION: R>L TTP of post tib; flexor digitorum, quadratus plantae insertions to calcaneus; and soleus Hypomobile calcaneo/navicular joint in R foot as well as talocrural joint  LOWER EXTREMITY ROM:  Active ROM Right eval Left eval  Hip flexion    Hip extension    Hip abduction    Hip adduction    Hip internal rotation    Hip external rotation    Knee flexion    Knee extension  Ankle dorsiflexion 8 5  Ankle plantarflexion 40 55  Ankle inversion 25 20  Ankle eversion 13 17   (Blank rows = not tested)  LOWER EXTREMITY MMT:  MMT Right eval Left eval  Hip flexion 5 4+  Hip extension TBA TBA  Hip abduction TBA TBA  Hip adduction    Hip internal rotation    Hip external rotation    Knee flexion 4+ 4  Knee extension 5 5  Ankle dorsiflexion 3+ 4+  Ankle plantarflexion    Ankle inversion 4- 4  Ankle eversion 5 4   (Blank rows = not tested)  LOWER EXTREMITY SPECIAL TESTS:  Did not assess  FUNCTIONAL TESTS:  SLS TBA  GAIT: Distance walked: Into clinic Assistive device utilized: None Level of assistance: Complete Independence Comments: Antalgic gait pattern, R hip and knee slightly flexed but demos diminished heel strike and toe off bilaterally. Bilat foot pronation noted with stance phase (L worse than R)  [facility] Adult PT Treatment:                                                DATE: 10/06/23 Seated calf stretch with strap 2 x 30 sec  MTPR along the R Calf and taught how to perform at home Reviewed HEP  Ball squeeze between heels 10x3 Ball squeeze between heels + heel raise 10x3 Ankle inversion iso with ankle DF 10x3 Towel scrunch x20 K-tape I strip for Achilles/arch and another I strip for post tib facilitation  and pain inhibition Educated on tape use and how to find it at the store                                                                                                                               TREATMENT DATE: 09/23/23 Neuromuscular re-ed: Trial of K-tape for Achilles/arch and another I strip for post tib facilitation and pain inhibition Therapeutic exercise: See HEP below  Self Care: See educatoin below   PATIENT EDUCATION:  Education details: Frozen water bottle to massage arches, massaging post tib, k-taping, initial HEP, PT exam findings and POC Person educated: Patient Education method: Explanation, Demonstration, and Handouts Education comprehension: verbalized understanding, returned demonstration, and needs further education  HOME EXERCISE PROGRAM: Access Code: [email or web address] URL: [email or web address] Date: 10/06/2023 Prepared by: Gellen April Marie Nonato  Exercises - Foot Roller Plantar Massage  - 1 x daily - 7 x weekly - 2 sets - 30 sec hold - Seated Ankle Inversion Eversion PROM  - 2-3 x daily - 7 x weekly - 2 sets - 30 sec hold - Arch Lifting  - 1 x daily - 7 x weekly - 2 sets - 10 reps - Standing Bilateral Soleus Stretch at Counter  - 1-3 x daily - 7 x weekly - 2 sets - 30 sec hold -  Standing Bilateral Gastrocnemius Stretch at Counter  - 1-3 x daily - 7 x weekly - 2 sets - 30 sec hold - Isometric Ankle Inversion  - 1 x daily - 7 x weekly - 1 sets - 10 reps - 3 sec hold - Seated Calf Raise With Small Ball at Heels  - 1 x daily - 7 x weekly - 1 sets - 10 reps - 3 sec hold - Towel Scrunches  - 1 x daily - 7 x weekly - 2 sets - 10 reps  ASSESSMENT:  CLINICAL IMPRESSION: Treatment session focused on reviewing HEP and initiated gentle isometrics for post tib and gastroc/soleus strengthening. Needed review for arch lifting and to hold her stretches for longer periods. Educated pt on self massage/MFR at home as well as taping as she did find this helpful.    From eval: Patient is a 53 y.o. F who was seen today for physical therapy evaluation and treatment for bilat heel pain. PMH significant for R heel surgery to remove calcium build up. Assessment demos R>L foot/ankle medial/lateral hypomobility, R>L posterior tibialis tenderness/pain (especially in insertion along medial arch), bilat foot pronation with standing/weight bearing, and bilat foot/ankle weakness (R worse than L) affecting pt's tolerance to standing and walking activities. Pt will benefit from PT to improve on these issues to maximize her level of function. Performed trial of ktape to better support pt's post tib/arch and provided pt HEP to start working on gentle mobility at home.   OBJECTIVE IMPAIRMENTS: Abnormal gait, decreased activity tolerance, decreased balance, decreased endurance, decreased mobility, difficulty walking, decreased ROM, decreased strength, hypomobility, increased fascial restrictions, increased muscle spasms, improper body mechanics, postural dysfunction, and pain.   ACTIVITY LIMITATIONS: lifting, standing, squatting, stairs, transfers, and locomotion level  PARTICIPATION LIMITATIONS: meal prep, cleaning, laundry, shopping, community activity, and yard work  PERSONAL FACTORS: Age, Fitness, Past/current experiences, and Time since onset of injury/illness/exacerbation are also affecting patient's functional outcome.   REHAB POTENTIAL: Good  CLINICAL DECISION MAKING: Evolving/moderate complexity  EVALUATION COMPLEXITY: Moderate   GOALS: Goals reviewed with patient? Yes  SHORT TERM GOALS: Target date: 10/21/2023  Pt will be ind with initial HEP Baseline: Goal status: INITIAL  2.  Pt will report reduction of pain by >/=25% Baseline:  Goal status: INITIAL  3.  Pt will demo improved ankle eversion to at least 20 deg for increased ROM Baseline:  Goal status: INITIAL   LONG TERM GOALS: Target date: 11/18/2023   Pt will be ind with management and  progression of HEP Baseline:  Goal status: INITIAL  2.  Pt will have improved PSFS average score to >/=7 to demo MCID Baseline:  Goal status: INITIAL  3.  Pt will have improved LEFS score to >/=54 to demo MCID Baseline: 42 Goal status: INITIAL  4.  Pt will be able to tolerate SLS for at least 30 sec to demo increased LE stability Baseline:  Goal status: INITIAL  5.  Pt will report improved overall pain by >/=50% Baseline:  Goal status: INITIAL  6.  Pt will be able to walk with normal reciprocal pattern x 200' for household ambulation  Baseline:  Goal status: INITIAL   PLAN:  PT FREQUENCY: 2x/week  PT DURATION: 8 weeks  PLANNED INTERVENTIONS: 97164- PT Re-evaluation, 97750- Physical Performance Testing, 97110-Therapeutic exercises, 97530- Therapeutic activity, [id number]- Neuromuscular re-education, 97535- Self Care, 02859- Manual therapy, [id number]- Gait training, [phone]- Aquatic Therapy, [phone]- Electrical stimulation (unattended), 97016- Vasopneumatic device, [id number]- Ultrasound, [id number]- Ionotophoresis 4mg /ml Dexamethasone , 79439 (1-2  muscles), 20561 (3+ muscles)- Dry Needling, Patient/Family education, Balance training, Stair training, Taping, Joint mobilization, Spinal mobilization, Cryotherapy, and Moist heat  PLAN FOR NEXT SESSION: Assess response to HEP. Review taping technique as needed. Continue ankle mobility/strengthening. Focus on arch/posterior tib eccentrics/strengthening. Check SLS. Work on Animator.    Gellen April Ma L Nonato, , DPT 10/06/2023, 8:31 AM  Date of referral: 09/09/23 Referring provider: Joya Stabs, DPM  Referring diagnosis?  M72.2 (ICD-10-CM) - Plantar fasciitis  M76.61 (ICD-10-CM) - Tendonitis, Achilles, right  M76.62 (ICD-10-CM) - Tendonitis, Achilles, left   Treatment diagnosis? (if different than referring diagnosis) Pain in left ankle and joints of left foot [M25.572]  Pain in right ankle and joints of right foot [M25.571]   What was  this (referring dx) caused by? Ongoing Issue and Arthritis  Lysle of Condition: Chronic (continuous duration > 3 months)   Laterality: Both  Current Functional Measure Score: LEFS 42  Objective measurements identify impairments when they are compared to normal values, the uninvolved extremity, and prior level of function.  []  Yes  []  No  Objective assessment of functional ability: Moderate functional limitations   Briefly describe symptoms: bilat posterior and plantar heel aching pain  How did symptoms start: Years ago with prolonged standing and walking  Average pain intensity:  Last 24 hours: 4-5  Past week: 4-5  How often does the pt experience symptoms? Constantly  How much have the symptoms interfered with usual daily activities? Moderately  How has condition changed since care began at this facility? NA - initial visit  In general, how is the patients overall health? Very Good   BACK PAIN (STarT Back Screening Tool) No

## 2023-10-08 ENCOUNTER — Encounter: Payer: Self-pay | Admitting: Physical Therapy

## 2023-10-08 ENCOUNTER — Ambulatory Visit: Admitting: Physical Therapy

## 2023-10-08 DIAGNOSIS — M722 Plantar fascial fibromatosis: Secondary | ICD-10-CM | POA: Diagnosis not present

## 2023-10-08 DIAGNOSIS — M7662 Achilles tendinitis, left leg: Secondary | ICD-10-CM | POA: Diagnosis not present

## 2023-10-08 DIAGNOSIS — M7661 Achilles tendinitis, right leg: Secondary | ICD-10-CM | POA: Diagnosis not present

## 2023-10-08 DIAGNOSIS — R2689 Other abnormalities of gait and mobility: Secondary | ICD-10-CM | POA: Diagnosis not present

## 2023-10-08 DIAGNOSIS — M6281 Muscle weakness (generalized): Secondary | ICD-10-CM | POA: Diagnosis not present

## 2023-10-08 DIAGNOSIS — M25571 Pain in right ankle and joints of right foot: Secondary | ICD-10-CM

## 2023-10-08 DIAGNOSIS — M25572 Pain in left ankle and joints of left foot: Secondary | ICD-10-CM | POA: Diagnosis not present

## 2023-10-08 NOTE — Therapy (Signed)
 OUTPATIENT PHYSICAL THERAPY TREATMENT   Patient Name: Melinda Elliott MRN: [id number] DOB:03/12/1970, 53 y.o., female Today's Date: 10/08/2023  END OF SESSION:  PT End of Session - 10/08/23 0801     Visit Number 3    Number of Visits 12    Date for PT Re-Evaluation 11/04/23    Authorization Type [employer] Medicare/MCD    Authorization Time Period No auth required    Progress Note Due on Visit 10    PT Start Time 0801    PT Stop Time 0840    PT Time Calculation (min) 39 min    Activity Tolerance Patient tolerated treatment well    Behavior During Therapy WFL for tasks assessed/performed          Past Medical History:  Diagnosis Date   Anxiety    Asthma    Cataracts, both eyes    Depression    Glaucoma    Heart murmur    mild no cardiology   Migraines    Schizoaffective disorder (HCC)    Wears dentures full set    Wears glasses    Past Surgical History:  Procedure Laterality Date   ABDOMINAL SURGERY     CESAREAN SECTION     EPIGASTRIC HERNIA REPAIR N/A 07/29/2022   Procedure: HERNIA REPAIR EPIGASTRIC ADULT;  Surgeon: Signe Mitzie LABOR, MD;  [facility];  Service: General;  Laterality: N/A;   ESOPHAGOGASTRODUODENOSCOPY (EGD) WITH PROPOFOL  N/A 07/02/2022   Procedure: ESOPHAGOGASTRODUODENOSCOPY (EGD) WITH PROPOFOL ;  Surgeon: Dianna Specking, MD;  Location: [facility];  Service: Gastroenterology;  Laterality: N/A;   GASTRIC BYPASS  03/01/2003   at [facility]   HERNIA REPAIR     HYSTEROSCOPY WITH D & C N/A 08/02/2019   Procedure: DILATATION AND CURETTAGE /HYSTEROSCOPY;  Surgeon: Marilynn Nest, DO;  Location: [facility];  Service: Gynecology;  Laterality: N/A;   LASIK     right ankle surgery achilles tendon repair  08/11/2021   surgery for twisting of intestines 2008 and 2011     Patient Active Problem List   Diagnosis Date Noted   Midepigastric pain 06/30/2022    PCP: Vernon Velna SAUNDERS, MD  REFERRING PROVIDER: Joya Stabs,  DPM  REFERRING DIAG: M72.2 (ICD-10-CM) - Plantar fasciitis M76.61 (ICD-10-CM) - Tendonitis, Achilles, right M76.62 (ICD-10-CM) - Tendonitis, Achilles, left  THERAPY DIAG:  Pain in left ankle and joints of left foot  Pain in right ankle and joints of right foot  Muscle weakness (generalized)  Other abnormalities of gait and mobility  Rationale for Evaluation and Treatment: Rehabilitation  ONSET DATE: 2017  SUBJECTIVE:   SUBJECTIVE STATEMENT: 10/08/2023 Pt states Wednesday when she woke up it was the best she's felt in a long time.   From eval:  Pt states it started in 2017. Pt used to stand and walk a lot. Started to get a lot of pressure in the bottom of the heels and went from there to the back of the heels/tendon. Sometimes on the L foot it gets numb on 4th toe. Pt reports she is unable to stand for very long. Currently even aches at rest. Used to go to the gym and work out. Pt had R Achilles heel surgery in July 2023 with plans for L Achilles surgery 6 months later to address the calcium build up on her posterior heels. Pain was improved at the time but now it's hurting the same as before the surgery. Tried to get back to normal routine of walking (loves  to walk) ~2 months ago. Pt will see podiatry for orthotics and get better shoe. Does note some increased L knee aching from compensatory motions.   PERTINENT HISTORY: N/a  PAIN:  Are you having pain? Yes: NPRS scale: 2 or 3  Pain location: Bilat heels plantar surface and posterior Pain description: Aching Aggravating factors: Standing (15 min), walking (15 min) Relieving factors: Turmeric  PRECAUTIONS: None  RED FLAGS: None   WEIGHT BEARING RESTRICTIONS: No  FALLS:  Has patient fallen in last 6 months? No  LIVING ENVIRONMENT: Lives with: lives alone Lives in: House/apartment Stairs: 1 step to enter Has following equipment at home: None  OCCUPATION: Not working currently  PLOF: Likes to play with her  grandchildren and be active  PATIENT GOALS: Return to her normal activities with less pain  NEXT MD VISIT: PRN  OBJECTIVE:  Note: Objective measures were completed at Evaluation unless otherwise noted.  DIAGNOSTIC FINDINGS: 09/09/23 bilat foot/ankle x-rays impression in office note states Xrays reviewed. No acute fractures or dislocations noted. Spurring noted to posterior calcaneus on left. Resection of haglunds on right with residual drill holes noted from anchor insertions.   PATIENT SURVEYS:  PSFS: THE PATIENT SPECIFIC FUNCTIONAL SCALE  Place score of 0-10 (0 = unable to perform activity and 10 = able to perform activity at the same level as before injury or problem)  Activity Date: 09/23/23    Standing 5    2.  Walking 5    3.       4.      Total Score 5      Total Score = Sum of activity scores/number of activities  Minimally Detectable Change: 3 points (for single activity); 2 points (for average score)  [facility] (nd). The Patient Specific Functional Scale . Retrieved from [email or web address]     Extreme difficulty/unable (0), Quite a bit of difficulty (1), Moderate difficulty (2), Little difficulty (3), No difficulty (4) Survey date:  09/23/23  Any of your usual work, housework or school activities 2  2. Usual hobbies, recreational or sporting activities 2  3. Getting into/out of the bath 2  4. Walking between rooms 2  5. Putting on socks/shoes 4  6. Squatting  2  7. Lifting an object, like a bag of groceries from the floor 4  8. Performing light activities around your home 4  9. Performing heavy activities around your home 3  10. Getting into/out of a car 1  11. Walking 2 blocks 1  12. Walking 1 mile 1  13. Going up/down 10 stairs (1 flight) 3  14. Standing for 1 hour 0  15.  sitting for 1 hour 1  16. Running on even ground 0  17. Running on uneven ground 0  18. Making sharp turns while  running fast 2  19. Hopping  4  20. Rolling over in bed 4  Score total:  42     COGNITION: Overall cognitive status: Within functional limits for tasks assessed     SENSATION: WFL  EDEMA:  Can get edematous  MUSCLE LENGTH: Did not assess  POSTURE: Slight knee valgus with standing, feet pronated  PALPATION: R>L TTP of post tib; flexor digitorum, quadratus plantae insertions to calcaneus; and soleus Hypomobile calcaneo/navicular joint in R foot as well as talocrural joint  LOWER EXTREMITY ROM:  Active ROM Right eval Left eval  Hip flexion    Hip extension    Hip abduction    Hip adduction  Hip internal rotation    Hip external rotation    Knee flexion    Knee extension    Ankle dorsiflexion 8 5  Ankle plantarflexion 40 55  Ankle inversion 25 20  Ankle eversion 13 17   (Blank rows = not tested)  LOWER EXTREMITY MMT:  MMT Right eval Left eval  Hip flexion 5 4+  Hip extension TBA TBA  Hip abduction TBA TBA  Hip adduction    Hip internal rotation    Hip external rotation    Knee flexion 4+ 4  Knee extension 5 5  Ankle dorsiflexion 3+ 4+  Ankle plantarflexion    Ankle inversion 4- 4  Ankle eversion 5 4   (Blank rows = not tested)  LOWER EXTREMITY SPECIAL TESTS:  Did not assess  FUNCTIONAL TESTS:  SLS TBA  GAIT: Distance walked: Into clinic Assistive device utilized: None Level of assistance: Complete Independence Comments: Antalgic gait pattern, R hip and knee slightly flexed but demos diminished heel strike and toe off bilaterally. Bilat foot pronation noted with stance phase (L worse than R)  [facility] Adult PT Treatment:                                                DATE: 10/08/23 Seated calf stretch with strap x 30 sec  Standing gastroc stretch x 30 Standing soleus stretch x 30 STM & TPR post tib and gastroc/soleus Ball squeeze between heels + heel raise 2x10x3 Ankle inversion + DF iso against ball 2x10x3 Ankle eversion + DF red TB  2x10x3 Standing Arch lift x10 Towel scrunch x20 K-tape I strip for Achilles/arch and another I strip for post tib facilitation and pain inhibition   [facility] Adult PT Treatment:                                                DATE: 10/06/23 Seated calf stretch with strap 2 x 30 sec  MTPR along the R Calf and taught how to perform at home Reviewed HEP  Ball squeeze between heels 10x3 Ball squeeze between heels + heel raise 10x3 Ankle inversion iso with ankle DF 10x3 Towel scrunch x20 K-tape I strip for Achilles/arch and another I strip for post tib facilitation and pain inhibition Educated on tape use and how to find it at the store                                                                                                                               TREATMENT DATE: 09/23/23 Neuromuscular re-ed: Trial of K-tape for Achilles/arch and another I strip for post tib facilitation and pain inhibition Therapeutic exercise: See HEP below  Self Care:  See educatoin below   PATIENT EDUCATION:  Education details: Frozen water bottle to massage arches, massaging post tib, k-taping, initial HEP, PT exam findings and POC Person educated: Patient Education method: Explanation, Demonstration, and Handouts Education comprehension: verbalized understanding, returned demonstration, and needs further education  HOME EXERCISE PROGRAM: Access Code: [email or web address] URL: [email or web address] Date: 10/06/2023 Prepared by: Jackob Crookston April Marie Joaquim Tolen  Exercises - Foot Roller Plantar Massage  - 1 x daily - 7 x weekly - 2 sets - 30 sec hold - Seated Ankle Inversion Eversion PROM  - 2-3 x daily - 7 x weekly - 2 sets - 30 sec hold - Arch Lifting  - 1 x daily - 7 x weekly - 2 sets - 10 reps - Standing Bilateral Soleus Stretch at Counter  - 1-3 x daily - 7 x weekly - 2 sets - 30 sec hold - Standing Bilateral Gastrocnemius Stretch at Counter  - 1-3 x daily - 7 x weekly - 2 sets - 30 sec  hold - Isometric Ankle Inversion  - 1 x daily - 7 x weekly - 1 sets - 10 reps - 3 sec hold - Seated Calf Raise With Small Ball at Heels  - 1 x daily - 7 x weekly - 1 sets - 10 reps - 3 sec hold - Towel Scrunches  - 1 x daily - 7 x weekly - 2 sets - 10 reps  ASSESSMENT:  CLINICAL IMPRESSION: Pt comes in with less pain. Less trigger points noted in post tib and gastroc/soleus today. Continued to work on improving arch strength and general ankle and foot intrinsic strength. Pt is progressing well with her strengthening.   From eval: Patient is a 53 y.o. F who was seen today for physical therapy evaluation and treatment for bilat heel pain. PMH significant for R heel surgery to remove calcium build up. Assessment demos R>L foot/ankle medial/lateral hypomobility, R>L posterior tibialis tenderness/pain (especially in insertion along medial arch), bilat foot pronation with standing/weight bearing, and bilat foot/ankle weakness (R worse than L) affecting pt's tolerance to standing and walking activities. Pt will benefit from PT to improve on these issues to maximize her level of function. Performed trial of ktape to better support pt's post tib/arch and provided pt HEP to start working on gentle mobility at home.   OBJECTIVE IMPAIRMENTS: Abnormal gait, decreased activity tolerance, decreased balance, decreased endurance, decreased mobility, difficulty walking, decreased ROM, decreased strength, hypomobility, increased fascial restrictions, increased muscle spasms, improper body mechanics, postural dysfunction, and pain.   ACTIVITY LIMITATIONS: lifting, standing, squatting, stairs, transfers, and locomotion level  PARTICIPATION LIMITATIONS: meal prep, cleaning, laundry, shopping, community activity, and yard work  PERSONAL FACTORS: Age, Fitness, Past/current experiences, and Time since onset of injury/illness/exacerbation are also affecting patient's functional outcome.   REHAB POTENTIAL: Good  CLINICAL  DECISION MAKING: Evolving/moderate complexity  EVALUATION COMPLEXITY: Moderate   GOALS: Goals reviewed with patient? Yes  SHORT TERM GOALS: Target date: 10/21/2023  Pt will be ind with initial HEP Baseline: Goal status: INITIAL  2.  Pt will report reduction of pain by >/=25% Baseline:  Goal status: INITIAL  3.  Pt will demo improved ankle eversion to at least 20 deg for increased ROM Baseline:  Goal status: INITIAL   LONG TERM GOALS: Target date: 11/18/2023   Pt will be ind with management and progression of HEP Baseline:  Goal status: INITIAL  2.  Pt will have improved PSFS average score to >/=7 to demo MCID Baseline:  Goal status: INITIAL  3.  Pt will have improved LEFS score to >/=54 to demo MCID Baseline: 42 Goal status: INITIAL  4.  Pt will be able to tolerate SLS for at least 30 sec to demo increased LE stability Baseline:  Goal status: INITIAL  5.  Pt will report improved overall pain by >/=50% Baseline:  Goal status: INITIAL  6.  Pt will be able to walk with normal reciprocal pattern x 200' for household ambulation  Baseline:  Goal status: INITIAL   PLAN:  PT FREQUENCY: 2x/week  PT DURATION: 8 weeks  PLANNED INTERVENTIONS: 97164- PT Re-evaluation, 97750- Physical Performance Testing, 97110-Therapeutic exercises, 97530- Therapeutic activity, 97112- Neuromuscular re-education, 97535- Self Care, 02859- Manual therapy, [id number]- Gait training, [phone]- Aquatic Therapy, [phone]- Electrical stimulation (unattended), 97016- Vasopneumatic device, [id number]- Ultrasound, [id number]- Ionotophoresis 4mg /ml Dexamethasone , 79439 (1-2 muscles), 20561 (3+ muscles)- Dry Needling, Patient/Family education, Balance training, Stair training, Taping, Joint mobilization, Spinal mobilization, Cryotherapy, and Moist heat  PLAN FOR NEXT SESSION: Assess response to HEP. Review taping technique as needed. Continue ankle mobility/strengthening. Focus on arch/posterior tib  eccentrics/strengthening. Check SLS. Work on Animator.    Amaranta Mehl April Ma L Cordaro Mukai, [place], DPT 10/08/2023, 8:02 AM  Date of referral: 09/09/23 Referring provider: Joya Stabs, DPM  Referring diagnosis?  M72.2 (ICD-10-CM) - Plantar fasciitis  M76.61 (ICD-10-CM) - Tendonitis, Achilles, right  M76.62 (ICD-10-CM) - Tendonitis, Achilles, left   Treatment diagnosis? (if different than referring diagnosis) Pain in left ankle and joints of left foot [M25.572]  Pain in right ankle and joints of right foot [M25.571]   What was this (referring dx) caused by? Ongoing Issue and Arthritis  Lysle of Condition: Chronic (continuous duration > 3 months)   Laterality: Both  Current Functional Measure Score: LEFS 42  Objective measurements identify impairments when they are compared to normal values, the uninvolved extremity, and prior level of function.  []  Yes  []  No  Objective assessment of functional ability: Moderate functional limitations   Briefly describe symptoms: bilat posterior and plantar heel aching pain  How did symptoms start: Years ago with prolonged standing and walking  Average pain intensity:  Last 24 hours: 4-5  Past week: 4-5  How often does the pt experience symptoms? Constantly  How much have the symptoms interfered with usual daily activities? Moderately  How has condition changed since care began at this facility? NA - initial visit  In general, how is the patients overall health? Very Good   BACK PAIN (STarT Back Screening Tool) No

## 2023-10-13 DIAGNOSIS — Z961 Presence of intraocular lens: Secondary | ICD-10-CM | POA: Diagnosis not present

## 2023-10-13 DIAGNOSIS — H04123 Dry eye syndrome of bilateral lacrimal glands: Secondary | ICD-10-CM | POA: Diagnosis not present

## 2023-10-13 DIAGNOSIS — Q112 Microphthalmos: Secondary | ICD-10-CM | POA: Diagnosis not present

## 2023-10-13 DIAGNOSIS — H402233 Chronic angle-closure glaucoma, bilateral, severe stage: Secondary | ICD-10-CM | POA: Diagnosis not present

## 2023-10-16 ENCOUNTER — Ambulatory Visit: Attending: Podiatry | Admitting: Physical Therapy

## 2023-10-16 ENCOUNTER — Encounter: Payer: Self-pay | Admitting: Physical Therapy

## 2023-10-16 DIAGNOSIS — M25571 Pain in right ankle and joints of right foot: Secondary | ICD-10-CM | POA: Insufficient documentation

## 2023-10-16 DIAGNOSIS — M6281 Muscle weakness (generalized): Secondary | ICD-10-CM | POA: Diagnosis not present

## 2023-10-16 DIAGNOSIS — M25572 Pain in left ankle and joints of left foot: Secondary | ICD-10-CM | POA: Insufficient documentation

## 2023-10-16 DIAGNOSIS — R2689 Other abnormalities of gait and mobility: Secondary | ICD-10-CM | POA: Insufficient documentation

## 2023-10-16 NOTE — Therapy (Addendum)
 PHYSICAL THERAPY UNPLANNED DISCHARGE SUMMARY   Visits from Start of Care: 4  Current functional level related to goals / functional outcomes: Current status unknown   Remaining deficits: Current status unknown   Education / Equipment: Pt has not returned since visit listed below  Patient goals were not assessed. Patient is being discharged due to not returning since the last visit.  (the note below was addended to include the above D/C summary on 01/27/2024)  OUTPATIENT PHYSICAL THERAPY TREATMENT   Patient Name: LEIGHANNE ADOLPH MRN: [id number] DOB:[birth year], 53 y.o., female Today's Date: 10/16/2023  END OF SESSION:  PT End of Session - 10/16/23 1004     Visit Number 4    Number of Visits 12    Date for PT Re-Evaluation 11/04/23    Authorization Type [employer] Medicare/MCD    Authorization Time Period No auth required    Progress Note Due on Visit 10    PT Start Time 1015    PT Stop Time 1056    PT Time Calculation (min) 41 min    Activity Tolerance Patient tolerated treatment well    Behavior During Therapy WFL for tasks assessed/performed          Past Medical History:  Diagnosis Date   Anxiety    Asthma    Cataracts, both eyes    Depression    Glaucoma    Heart murmur    mild no cardiology   Migraines    Schizoaffective disorder (HCC)    Wears dentures full set    Wears glasses    Past Surgical History:  Procedure Laterality Date   ABDOMINAL SURGERY     CESAREAN SECTION     EPIGASTRIC HERNIA REPAIR N/A 07/29/2022   Procedure: HERNIA REPAIR EPIGASTRIC ADULT;  Surgeon: Signe Mitzie LABOR, MD;  Location: [facility];  Service: General;  Laterality: N/A;   ESOPHAGOGASTRODUODENOSCOPY (EGD) WITH PROPOFOL  N/A 07/02/2022   Procedure: ESOPHAGOGASTRODUODENOSCOPY (EGD) WITH PROPOFOL ;  Surgeon: Dianna Specking, MD;  Location: [facility];  Service: Gastroenterology;  Laterality: N/A;   GASTRIC BYPASS  03/01/2003   at [facility]   HERNIA REPAIR      HYSTEROSCOPY WITH D & C N/A 08/02/2019   Procedure: DILATATION AND CURETTAGE /HYSTEROSCOPY;  Surgeon: Marilynn Nest, DO;  Location: [facility];  Service: Gynecology;  Laterality: N/A;   LASIK     right ankle surgery achilles tendon repair  08/11/2021   surgery for twisting of intestines 2008 and 2011     Patient Active Problem List   Diagnosis Date Noted   Midepigastric pain 06/30/2022    PCP: Vernon Velna SAUNDERS, MD  REFERRING PROVIDER: Joya Stabs, DPM  REFERRING DIAG: M72.2 (ICD-10-CM) - Plantar fasciitis M76.61 (ICD-10-CM) - Tendonitis, Achilles, right M76.62 (ICD-10-CM) - Tendonitis, Achilles, left  THERAPY DIAG:  Pain in left ankle and joints of left foot  Pain in right ankle and joints of right foot  Muscle weakness (generalized)  Other abnormalities of gait and mobility  Rationale for Evaluation and Treatment: Rehabilitation  ONSET DATE: 2017  SUBJECTIVE:   SUBJECTIVE STATEMENT: 10/16/2023 Pt reports that overall things have been improving.  She had a bit more soreness on Wednesday but did her exercises and felt better.   From eval:  Pt states it started in 2017. Pt used to stand and walk a lot. Started to get a lot of pressure in the bottom of the heels and went from there to the back of the heels/tendon. Sometimes  on the L foot it gets numb on 4th toe. Pt reports she is unable to stand for very long. Currently even aches at rest. Used to go to the gym and work out. Pt had R Achilles heel surgery in July 2023 with plans for L Achilles surgery 6 months later to address the calcium build up on her posterior heels. Pain was improved at the time but now it's hurting the same as before the surgery. Tried to get back to normal routine of walking (loves to walk) ~2 months ago. Pt will see podiatry for orthotics and get better shoe. Does note some increased L knee aching from compensatory motions.   PERTINENT HISTORY: N/a  PAIN:  Are you having pain? Yes:  NPRS scale: 2 or 3  Pain location: Bilat heels plantar surface and posterior Pain description: Aching Aggravating factors: Standing (15 min), walking (15 min) Relieving factors: Turmeric  PRECAUTIONS: None  RED FLAGS: None   WEIGHT BEARING RESTRICTIONS: No  FALLS:  Has patient fallen in last 6 months? No  LIVING ENVIRONMENT: Lives with: lives alone Lives in: House/apartment Stairs: 1 step to enter Has following equipment at home: None  OCCUPATION: Not working currently  PLOF: Likes to play with her grandchildren and be active  PATIENT GOALS: Return to her normal activities with less pain  NEXT MD VISIT: PRN  OBJECTIVE:  Note: Objective measures were completed at Evaluation unless otherwise noted.  DIAGNOSTIC FINDINGS: 09/09/23 bilat foot/ankle x-rays impression in office note states Xrays reviewed. No acute fractures or dislocations noted. Spurring noted to posterior calcaneus on left. Resection of haglunds on right with residual drill holes noted from anchor insertions.   PATIENT SURVEYS:  PSFS: THE PATIENT SPECIFIC FUNCTIONAL SCALE  Place score of 0-10 (0 = unable to perform activity and 10 = able to perform activity at the same level as before injury or problem)  Activity Date: 09/23/23    Standing 5    2.  Walking 5    3.       4.      Total Score 5      Total Score = Sum of activity scores/number of activities  Minimally Detectable Change: 3 points (for single activity); 2 points (for average score)  [facility] (nd). The Patient Specific Functional Scale . Retrieved from [email or web address]     Extreme difficulty/unable (0), Quite a bit of difficulty (1), Moderate difficulty (2), Little difficulty (3), No difficulty (4) Survey date:  09/23/23  Any of your usual work, housework or school activities 2  2. Usual hobbies, recreational or sporting activities 2  3. Getting into/out of the bath  2  4. Walking between rooms 2  5. Putting on socks/shoes 4  6. Squatting  2  7. Lifting an object, like a bag of groceries from the floor 4  8. Performing light activities around your home 4  9. Performing heavy activities around your home 3  10. Getting into/out of a car 1  11. Walking 2 blocks 1  12. Walking 1 mile 1  13. Going up/down 10 stairs (1 flight) 3  14. Standing for 1 hour 0  15.  sitting for 1 hour 1  16. Running on even ground 0  17. Running on uneven ground 0  18. Making sharp turns while running fast 2  19. Hopping  4  20. Rolling over in bed 4  Score total:  42     COGNITION: Overall cognitive status: Within functional limits  for tasks assessed     SENSATION: WFL  EDEMA:  Can get edematous  MUSCLE LENGTH: Did not assess  POSTURE: Slight knee valgus with standing, feet pronated  PALPATION: R>L TTP of post tib; flexor digitorum, quadratus plantae insertions to calcaneus; and soleus Hypomobile calcaneo/navicular joint in R foot as well as talocrural joint  LOWER EXTREMITY ROM:  Active ROM Right eval Left eval  Hip flexion    Hip extension    Hip abduction    Hip adduction    Hip internal rotation    Hip external rotation    Knee flexion    Knee extension    Ankle dorsiflexion 8 5  Ankle plantarflexion 40 55  Ankle inversion 25 20  Ankle eversion 13 17   (Blank rows = not tested)  LOWER EXTREMITY MMT:  MMT Right eval Left eval  Hip flexion 5 4+  Hip extension TBA TBA  Hip abduction TBA TBA  Hip adduction    Hip internal rotation    Hip external rotation    Knee flexion 4+ 4  Knee extension 5 5  Ankle dorsiflexion 3+ 4+  Ankle plantarflexion    Ankle inversion 4- 4  Ankle eversion 5 4   (Blank rows = not tested)  LOWER EXTREMITY SPECIAL TESTS:  Did not assess  FUNCTIONAL TESTS:  SLS TBA  GAIT: Distance walked: Into clinic Assistive device utilized: None Level of assistance: Complete Independence Comments: Antalgic  gait pattern, R hip and knee slightly flexed but demos diminished heel strike and toe off bilaterally. Bilat foot pronation noted with stance phase (L worse than R)  [facility] Adult PT Treatment:                                                DATE: 10/16/23  Therex: Seated calf stretch with sheet 2x30 sec  Standing gastroc stretch 1' x2 Ball squeeze between heels + heel raise 2x10x3 Ankle inversion green TB 2x10x3 Ankle eversion green TB 2x10x3  Therapeutic Activity  Standing Arch lift 2x10 Towel scrunch standing 2x 1' Heel raise with towel roll under toes - 4x 30   [facility] Adult PT Treatment:                                                DATE: 10/06/23 Seated calf stretch with strap 2 x 30 sec  MTPR along the R Calf and taught how to perform at home Reviewed HEP  Ball squeeze between heels 10x3 Ball squeeze between heels + heel raise 10x3 Ankle inversion iso with ankle DF 10x3 Towel scrunch x20 K-tape I strip for Achilles/arch and another I strip for post tib facilitation and pain inhibition Educated on tape use and how to find it at the store  TREATMENT DATE: 09/23/23 Neuromuscular re-ed: Trial of K-tape for Achilles/arch and another I strip for post tib facilitation and pain inhibition Therapeutic exercise: See HEP below  Self Care: See educatoin below   PATIENT EDUCATION:  Education details: Frozen water bottle to massage arches, massaging post tib, k-taping, initial HEP, PT exam findings and POC Person educated: Patient Education method: Explanation, Demonstration, and Handouts Education comprehension: verbalized understanding, returned demonstration, and needs further education  HOME EXERCISE PROGRAM: Access Code: [email or web address] URL: [email or web address] Date: 10/06/2023 Prepared by: Gellen April Marie Nonato  Exercises - Foot  Roller Plantar Massage  - 1 x daily - 7 x weekly - 2 sets - 30 sec hold - Seated Ankle Inversion Eversion PROM  - 2-3 x daily - 7 x weekly - 2 sets - 30 sec hold - Arch Lifting  - 1 x daily - 7 x weekly - 2 sets - 10 reps - Standing Bilateral Soleus Stretch at Counter  - 1-3 x daily - 7 x weekly - 2 sets - 30 sec hold - Standing Bilateral Gastrocnemius Stretch at Counter  - 1-3 x daily - 7 x weekly - 2 sets - 30 sec hold - Isometric Ankle Inversion  - 1 x daily - 7 x weekly - 1 sets - 10 reps - 3 sec hold - Seated Calf Raise With Small Ball at Heels  - 1 x daily - 7 x weekly - 1 sets - 10 reps - 3 sec hold - Towel Scrunches  - 1 x daily - 7 x weekly - 2 sets - 10 reps  ASSESSMENT:  CLINICAL IMPRESSION: Yahayra tolerated session well with no adverse reaction.  Lower pain today.  Added in heel raise with toes elevated for increased load on PF.  Pt notes tension in R foot but no significant increase in pain.  From eval: Patient is a 53 y.o. F who was seen today for physical therapy evaluation and treatment for bilat heel pain. PMH significant for R heel surgery to remove calcium build up. Assessment demos R>L foot/ankle medial/lateral hypomobility, R>L posterior tibialis tenderness/pain (especially in insertion along medial arch), bilat foot pronation with standing/weight bearing, and bilat foot/ankle weakness (R worse than L) affecting pt's tolerance to standing and walking activities. Pt will benefit from PT to improve on these issues to maximize her level of function. Performed trial of ktape to better support pt's post tib/arch and provided pt HEP to start working on gentle mobility at home.   OBJECTIVE IMPAIRMENTS: Abnormal gait, decreased activity tolerance, decreased balance, decreased endurance, decreased mobility, difficulty walking, decreased ROM, decreased strength, hypomobility, increased fascial restrictions, increased muscle spasms, improper body mechanics, postural dysfunction, and pain.    ACTIVITY LIMITATIONS: lifting, standing, squatting, stairs, transfers, and locomotion level  PARTICIPATION LIMITATIONS: meal prep, cleaning, laundry, shopping, community activity, and yard work  PERSONAL FACTORS: Age, Fitness, Past/current experiences, and Time since onset of injury/illness/exacerbation are also affecting patient's functional outcome.   REHAB POTENTIAL: Good  CLINICAL DECISION MAKING: Evolving/moderate complexity  EVALUATION COMPLEXITY: Moderate   GOALS: Goals reviewed with patient? Yes  SHORT TERM GOALS: Target date: 10/21/2023  Pt will be ind with initial HEP Baseline: Goal status: INITIAL  2.  Pt will report reduction of pain by >/=25% Baseline:  Goal status: INITIAL  3.  Pt will demo improved ankle eversion to at least 20 deg for increased ROM Baseline:  Goal status: INITIAL   LONG TERM GOALS: Target date: 11/18/2023   Pt will be ind with management  and progression of HEP Baseline:  Goal status: INITIAL  2.  Pt will have improved PSFS average score to >/=7 to demo MCID Baseline:  Goal status: INITIAL  3.  Pt will have improved LEFS score to >/=54 to demo MCID Baseline: 42 Goal status: INITIAL  4.  Pt will be able to tolerate SLS for at least 30 sec to demo increased LE stability Baseline:  Goal status: INITIAL  5.  Pt will report improved overall pain by >/=50% Baseline:  Goal status: INITIAL  6.  Pt will be able to walk with normal reciprocal pattern x 200' for household ambulation  Baseline:  Goal status: INITIAL   PLAN:  PT FREQUENCY: 2x/week  PT DURATION: 8 weeks  PLANNED INTERVENTIONS: 97164- PT Re-evaluation, 97750- Physical Performance Testing, 97110-Therapeutic exercises, 97530- Therapeutic activity, [id number]- Neuromuscular re-education, 97535- Self Care, 02859- Manual therapy, [id number]- Gait training, [phone]- Aquatic Therapy, [id number]- Electrical stimulation (unattended), 97016- Vasopneumatic device, [id number]- Ultrasound, [id number]-  Ionotophoresis 4mg /ml Dexamethasone , 79439 (1-2 muscles), 20561 (3+ muscles)- Dry Needling, Patient/Family education, Balance training, Stair training, Taping, Joint mobilization, Spinal mobilization, Cryotherapy, and Moist heat  PLAN FOR NEXT SESSION: Assess response to HEP. Review taping technique as needed. Continue ankle mobility/strengthening. Focus on arch/posterior tib eccentrics/strengthening. Check SLS. Work on animator.    Helene FORBES Gasmen, PT, DPT 10/16/2023, 11:13 AM  Date of referral: 09/09/23 Referring provider: Joya Stabs, DPM  Referring diagnosis?  M72.2 (ICD-10-CM) - Plantar fasciitis  M76.61 (ICD-10-CM) - Tendonitis, Achilles, right  M76.62 (ICD-10-CM) - Tendonitis, Achilles, left   Treatment diagnosis? (if different than referring diagnosis) Pain in left ankle and joints of left foot [M25.572]  Pain in right ankle and joints of right foot [M25.571]   What was this (referring dx) caused by? Ongoing Issue and Arthritis  Lysle of Condition: Chronic (continuous duration > 3 months)   Laterality: Both  Current Functional Measure Score: LEFS 42  Objective measurements identify impairments when they are compared to normal values, the uninvolved extremity, and prior level of function.  []  Yes  []  No  Objective assessment of functional ability: Moderate functional limitations   Briefly describe symptoms: bilat posterior and plantar heel aching pain  How did symptoms start: Years ago with prolonged standing and walking  Average pain intensity:  Last 24 hours: 4-5  Past week: 4-5  How often does the pt experience symptoms? Constantly  How much have the symptoms interfered with usual daily activities? Moderately  How has condition changed since care began at this facility? NA - initial visit  In general, how is the patients overall health? Very Good   BACK PAIN (STarT Back Screening Tool) No

## 2023-10-21 ENCOUNTER — Encounter: Payer: Self-pay | Admitting: Podiatry

## 2023-10-21 ENCOUNTER — Ambulatory Visit (INDEPENDENT_AMBULATORY_CARE_PROVIDER_SITE_OTHER): Admitting: Podiatry

## 2023-10-21 DIAGNOSIS — M7662 Achilles tendinitis, left leg: Secondary | ICD-10-CM

## 2023-10-21 DIAGNOSIS — M722 Plantar fascial fibromatosis: Secondary | ICD-10-CM | POA: Diagnosis not present

## 2023-10-21 DIAGNOSIS — M7661 Achilles tendinitis, right leg: Secondary | ICD-10-CM | POA: Diagnosis not present

## 2023-10-21 NOTE — Progress Notes (Signed)
  Subjective:  Patient ID: Melinda Elliott, female    DOB: 04/06/1970,   MRN: [id number]  Chief Complaint  Patient presents with   Tendonitis    They doing.  I want them to do better.  I'm doing the exercises that rehab has been teaching me.  The swelling is gone down.    53 y.o. female presents for follow-up of bilateral achilles tendonitis and plantar fasciitis. Relates doing better but still causing problems. Has been in PT. Swelling has improved.  . Denies any other pedal complaints. Denies n/v/f/c.   Past Medical History:  Diagnosis Date   Anxiety    Asthma    Cataracts, both eyes    Depression    Glaucoma    Heart murmur    mild no cardiology   Migraines    Schizoaffective disorder (HCC)    Wears dentures full set    Wears glasses     Objective:  Physical Exam: Vascular: DP/PT pulses 2/4 bilateral. CFT <3 seconds. Normal hair growth on digits. No edema.  Skin. No lacerations or abrasions bilateral feet.  Musculoskeletal: MMT 5/5 bilateral lower extremities in DF, PF, Inversion and Eversion. Deceased ROM in DF of ankle joint. Tender to the medial calcaneal tubercle bilaterally . Pain along achilles insertion and proximally along the tendon. On right palpable bulging of tendon near insertion and edema noted. No pain PT or arch. No pain with calcaneal squeeze.  Neurological: Sensation intact to light touch.   Assessment:   1. Tendonitis, Achilles, right   2. Tendonitis, Achilles, left   3. Plantar fasciitis       Plan:  Patient was evaluated and treated and all questions answered. -Xrays reviewed. No acute fractures or dislocations noted. Spurring noted to posterior calcaneus on left. Resection of haglunds on right with residual drill holes noted from anchor insertions.  -Discussed Achilles insertional tendonitis and treatment options with patient.  -Continue PT  -Discussed meloxicam and injections for PF but patient would like to continue with tumeric for now.   -Awaiting CMO and shoes.  Discussed possible PRP injection in the future -Discussed if no improvement will consider MRI/PRP injections.  -Patient to return to office in 8 weeks for recheck.    Asberry Failing, DPM

## 2023-10-26 ENCOUNTER — Ambulatory Visit (INDEPENDENT_AMBULATORY_CARE_PROVIDER_SITE_OTHER)

## 2023-10-26 DIAGNOSIS — M2141 Flat foot [pes planus] (acquired), right foot: Secondary | ICD-10-CM

## 2023-10-26 DIAGNOSIS — M2142 Flat foot [pes planus] (acquired), left foot: Secondary | ICD-10-CM

## 2023-10-26 DIAGNOSIS — M7661 Achilles tendinitis, right leg: Secondary | ICD-10-CM

## 2023-10-26 DIAGNOSIS — M722 Plantar fascial fibromatosis: Secondary | ICD-10-CM | POA: Diagnosis not present

## 2023-10-26 DIAGNOSIS — M7662 Achilles tendinitis, left leg: Secondary | ICD-10-CM

## 2023-10-26 NOTE — Progress Notes (Signed)
 Patient presents today to pick up Orthopedic shoes and custom molded orthotics   Ortho shoes and inserts fit very will providing adequate support patient was provided wear tear and break in instructions and will call if any problems arise   Re-appointment for regularly scheduled diabetic foot care visits or if they should experience any trouble with the shoes or insoles.    Lolita Schultze Cped, CFo, CFm

## 2023-11-03 ENCOUNTER — Telehealth: Payer: Self-pay | Admitting: Physical Therapy

## 2023-11-03 ENCOUNTER — Ambulatory Visit: Admitting: Physical Therapy

## 2023-11-03 NOTE — Telephone Encounter (Signed)
 Called and informed patient of missed visit and provided reminder of next appt and attendance policy via VM.  Will schedule only 1 visit at a time moving forward.

## 2023-11-05 ENCOUNTER — Ambulatory Visit: Admitting: Physical Therapy

## 2023-11-10 ENCOUNTER — Ambulatory Visit: Admitting: Physical Therapy

## 2023-11-12 ENCOUNTER — Encounter: Admitting: Physical Therapy

## 2023-11-16 ENCOUNTER — Other Ambulatory Visit

## 2023-12-28 ENCOUNTER — Ambulatory Visit (INDEPENDENT_AMBULATORY_CARE_PROVIDER_SITE_OTHER): Admitting: Podiatry

## 2023-12-28 DIAGNOSIS — Z91199 Patient's noncompliance with other medical treatment and regimen due to unspecified reason: Secondary | ICD-10-CM

## 2023-12-28 NOTE — Progress Notes (Signed)
 No show
